# Patient Record
Sex: Female | Born: 1995 | Race: Black or African American | Hispanic: No | Marital: Married | Smoking: Never smoker
Health system: Southern US, Community
[De-identification: ages and names within clinical notes are randomized; demographics above are authoritative.]

## PROBLEM LIST (undated history)

## (undated) ENCOUNTER — Inpatient Hospital Stay: Payer: Self-pay

## (undated) DIAGNOSIS — D649 Anemia, unspecified: Secondary | ICD-10-CM

## (undated) DIAGNOSIS — O1490 Unspecified pre-eclampsia, unspecified trimester: Secondary | ICD-10-CM

---

## 2013-06-26 DIAGNOSIS — O1490 Unspecified pre-eclampsia, unspecified trimester: Secondary | ICD-10-CM

## 2016-03-24 LAB — OB RESULTS CONSOLE HIV ANTIBODY (ROUTINE TESTING): HIV: NONREACTIVE

## 2016-03-24 LAB — OB RESULTS CONSOLE VARICELLA ZOSTER ANTIBODY, IGG: VARICELLA IGG: NON-IMMUNE/NOT IMMUNE

## 2016-03-24 LAB — OB RESULTS CONSOLE HEPATITIS B SURFACE ANTIGEN: HEP B S AG: NEGATIVE

## 2016-03-24 LAB — OB RESULTS CONSOLE RUBELLA ANTIBODY, IGM: Rubella: IMMUNE

## 2016-06-08 ENCOUNTER — Inpatient Hospital Stay
Admission: EM | Admit: 2016-06-08 | Discharge: 2016-06-08 | Disposition: A | Discharge status: Home / Self Care | Payer: Self-pay | Attending: Advanced Practice Midwife | Admitting: Advanced Practice Midwife

## 2016-06-08 DIAGNOSIS — O26852 Spotting complicating pregnancy, second trimester: Secondary | ICD-10-CM | POA: Diagnosis not present

## 2016-06-08 DIAGNOSIS — Z3A20 20 weeks gestation of pregnancy: Secondary | ICD-10-CM | POA: Diagnosis not present

## 2016-06-08 DIAGNOSIS — R109 Unspecified abdominal pain: Secondary | ICD-10-CM | POA: Diagnosis not present

## 2016-06-08 NOTE — Discharge Instructions (Signed)
Discharge & follow up instructions given, all questions answered. °

## 2016-06-08 NOTE — Discharge Summary (Signed)
Physician Final Progress Note  Patient ID: Amanda Welch MRN: [id number] DOB/AGE: 1995-05-09 21 y.o.  Admit date: 06/08/2016 Admitting provider: Tresea Mall, CNM Discharge date: 06/08/2016   Admission Diagnoses: bleeding, lower abdominal pain  Discharge Diagnoses:  Active Problems:   * No active hospital problems. * IUP at 103w2d with positive fetal heart tones, positive fetal movement, no active bleeding, no contractions  History of Present Illness: The patient is a 21 y.o. female G2P1001 at [redacted]w[redacted]d who presents for bleeding yesterday with scant spotting this morning. Pt admits most recent intercourse was yesterday. She states she has had lower abdominal pain throughout the pregnancy. Pt was placed on toco monitor, heart tones with hand held doppler. Pt was receiving care in [facility] and recently moved to [place]. She has not yet established prenatal care in this area.    History reviewed. No pertinent past medical history.  History reviewed. No pertinent surgical history.  No current facility-administered medications on file prior to encounter.    No current outpatient prescriptions on file prior to encounter.    No Known Allergies  Social History   Social History  . Marital status: Single    Spouse name: N/A  . Number of children: N/A  . Years of education: N/A   Occupational History  . Not on file.   Social History Main Topics  . Smoking status: Not on file  . Smokeless tobacco: Not on file  . Alcohol use Not on file  . Drug use: Unknown  . Sexual activity: Not on file   Other Topics Concern  . Not on file   Social History Narrative  . No narrative on file    Physical Exam: BP 108/71   Pulse 95   Temp 98.5 F (36.9 C) (Oral)   Gen: NAD CV: RRR Pulm: CTAB Pelvic: deferred, no current bleeding Toco: negative Fetal Well Being: doppler tones 145 to 155 Ext: no evidence of DVT  Consults: None  Significant Findings/ Diagnostic Studies: labs:  none  Procedures: on Toco monitor  Discharge Condition: good  Disposition: 01-Home or Self Care  Diet: Regular diet  Discharge Activity: Activity as tolerated  Discharge Instructions    Discharge activity:  No Restrictions    Complete by:  As directed    Discharge diet:  No restrictions    Complete by:  As directed    No sexual activity restrictions    Complete by:  As directed    Notify physician for a general feeling that "something is not right"    Complete by:  As directed    Notify physician for increase or change in vaginal discharge    Complete by:  As directed    Notify physician for intestinal cramps, with or without diarrhea, sometimes described as "gas pain"    Complete by:  As directed    Notify physician for leaking of fluid    Complete by:  As directed    Notify physician for low, dull backache, unrelieved by heat or Tylenol    Complete by:  As directed    Notify physician for menstrual like cramps    Complete by:  As directed    Notify physician for pelvic pressure    Complete by:  As directed    Notify physician for uterine contractions.  These may be painless and feel like the uterus is tightening or the baby is  "balling up"    Complete by:  As directed    Notify physician for vaginal  bleeding    Complete by:  As directed    PRETERM LABOR:  Includes any of the follwing symptoms that occur between 20 - [redacted] weeks gestation.  If these symptoms are not stopped, preterm labor can result in preterm delivery, placing your baby at risk    Complete by:  As directed    Sexual Activity:      Complete by:  As directed      Allergies as of 06/08/2016   No Known Allergies     Medication List    TAKE these medications   prenatal multivitamin Tabs tablet Take 1 tablet by mouth daily at 12 noon.      Follow-up Information    establish prenatal care with local provider Follow up.           Total time spent taking care of this patient: 15  minutes  Signed: Tresea Mall, CNM  06/08/2016, 8:43 PM

## 2016-07-23 ENCOUNTER — Observation Stay
Admission: EM | Admit: 2016-07-23 | Discharge: 2016-07-23 | Disposition: A | Discharge status: Home / Self Care | Payer: Medicaid Other | Attending: Certified Nurse Midwife | Admitting: Certified Nurse Midwife

## 2016-07-23 DIAGNOSIS — O34219 Maternal care for unspecified type scar from previous cesarean delivery: Secondary | ICD-10-CM | POA: Insufficient documentation

## 2016-07-23 DIAGNOSIS — R112 Nausea with vomiting, unspecified: Secondary | ICD-10-CM

## 2016-07-23 DIAGNOSIS — O9989 Other specified diseases and conditions complicating pregnancy, childbirth and the puerperium: Principal | ICD-10-CM | POA: Insufficient documentation

## 2016-07-23 DIAGNOSIS — O219 Vomiting of pregnancy, unspecified: Secondary | ICD-10-CM | POA: Diagnosis present

## 2016-07-23 DIAGNOSIS — R1030 Lower abdominal pain, unspecified: Secondary | ICD-10-CM | POA: Insufficient documentation

## 2016-07-23 DIAGNOSIS — O0932 Supervision of pregnancy with insufficient antenatal care, second trimester: Secondary | ICD-10-CM | POA: Diagnosis not present

## 2016-07-23 DIAGNOSIS — O09299 Supervision of pregnancy with other poor reproductive or obstetric history, unspecified trimester: Secondary | ICD-10-CM

## 2016-07-23 DIAGNOSIS — Z833 Family history of diabetes mellitus: Secondary | ICD-10-CM | POA: Diagnosis not present

## 2016-07-23 DIAGNOSIS — O09892 Supervision of other high risk pregnancies, second trimester: Secondary | ICD-10-CM | POA: Insufficient documentation

## 2016-07-23 DIAGNOSIS — Z3A26 26 weeks gestation of pregnancy: Secondary | ICD-10-CM | POA: Insufficient documentation

## 2016-07-23 HISTORY — DX: Anemia, unspecified: D64.9

## 2016-07-23 HISTORY — DX: Unspecified pre-eclampsia, unspecified trimester: O14.90

## 2016-07-23 LAB — URINALYSIS, COMPLETE (UACMP) WITH MICROSCOPIC
BACTERIA UA: NONE SEEN
BILIRUBIN URINE: NEGATIVE
Glucose, UA: NEGATIVE mg/dL
HGB URINE DIPSTICK: NEGATIVE
KETONES UR: NEGATIVE mg/dL
Nitrite: NEGATIVE
Protein, ur: 30 mg/dL — AB
Specific Gravity, Urine: 1.021 (ref 1.005–1.030)
pH: 7 (ref 5.0–8.0)

## 2016-07-23 LAB — BASIC METABOLIC PANEL
Anion gap: 6 (ref 5–15)
BUN: 9 mg/dL (ref 6–20)
CO2: 22 mmol/L (ref 22–32)
CREATININE: 0.41 mg/dL — AB (ref 0.44–1.00)
Calcium: 8.3 mg/dL — ABNORMAL LOW (ref 8.9–10.3)
Chloride: 108 mmol/L (ref 101–111)
GFR calc non Af Amer: >60 mL/min (ref >60)
Glucose, Bld: 81 mg/dL (ref 65–99)
Potassium: 3.9 mmol/L (ref 3.5–5.1)
SODIUM: 136 mmol/L (ref 135–145)

## 2016-07-23 LAB — CBC
HEMATOCRIT: 28.7 % — AB (ref 35.0–47.0)
HEMOGLOBIN: 9.4 g/dL — AB (ref 12.0–16.0)
MCH: 23.1 pg — ABNORMAL LOW (ref 26.0–34.0)
MCHC: 32.6 g/dL (ref 32.0–36.0)
MCV: 70.7 fL — AB (ref 80.0–100.0)
Platelets: 297 10*3/uL (ref 150–440)
RBC: 4.06 MIL/uL (ref 3.80–5.20)
RDW: 17.3 % — ABNORMAL HIGH (ref 11.5–14.5)
WBC: 11.2 10*3/uL — AB (ref 3.6–11.0)

## 2016-07-23 LAB — CHLAMYDIA/NGC RT PCR (ARMC ONLY)
CHLAMYDIA TR: NOT DETECTED
N gonorrhoeae: NOT DETECTED

## 2016-07-23 MED ORDER — CEPHALEXIN 500 MG PO CAPS: 500.0000 mg | ORAL_CAPSULE | Freq: Two times a day (BID) | ORAL | 0 refills | Status: DC

## 2016-07-23 MED ORDER — LACTATED RINGERS IV SOLN
INTRAVENOUS | Status: DC
  Administered 2016-07-23: 10:00:00 via INTRAVENOUS

## 2016-07-23 MED ORDER — CITRANATAL BLOOM 90-1 MG PO TABS: 1.0000 | ORAL_TABLET | Freq: Every day | ORAL | 11 refills | Status: DC

## 2016-07-23 MED ORDER — ONDANSETRON HCL 4 MG/2ML IJ SOLN
4.0000 mg | Freq: Four times a day (QID) | INTRAMUSCULAR | Status: DC | PRN
  Administered 2016-07-23: 4 mg via INTRAVENOUS
  Filled 2016-07-23: qty 2

## 2016-07-23 MED ORDER — LACTATED RINGERS IV BOLUS (SEPSIS)
500.0000 mL | Freq: Once | INTRAVENOUS | Status: AC
  Administered 2016-07-23: 500 mL via INTRAVENOUS

## 2016-07-23 MED ORDER — CEFOXITIN SODIUM-DEXTROSE 2-2.2 GM-% IV SOLR (PREMIX)
2.0000 g | Freq: Once | INTRAVENOUS | Status: AC
  Administered 2016-07-23: 2000 mg via INTRAVENOUS
  Filled 2016-07-23: qty 50

## 2016-07-23 MED ORDER — ONDANSETRON 4 MG PO TBDP: 4.0000 mg | ORAL_TABLET | Freq: Four times a day (QID) | ORAL | 0 refills | Status: DC | PRN

## 2016-07-23 NOTE — Discharge Instructions (Signed)
Nausea and Vomiting, Adult Feeling sick to your stomach (nausea) means that your stomach is upset or you feel like you have to throw up (vomit). Feeling more and more sick to your stomach can lead to throwing up. Throwing up happens when food and liquid from your stomach are thrown up and out the mouth. Throwing up can make you feel weak and cause you to get dehydrated. Dehydration can make you tired and thirsty, make you have a dry mouth, and make it so you pee (urinate) less often. Older adults and people with other diseases or a weak defense system (immune system) are at higher risk for dehydration. If you feel sick to your stomach or if you throw up, it is important to follow instructions from your doctor about how to take care of yourself. Follow these instructions at home: Eating and drinking Follow these instructions as told by your doctor:  Take an oral rehydration solution (ORS). This is a drink that is sold at pharmacies and stores.  Drink clear fluids in small amounts as you are able, such as: ? Water. ? Ice chips. ? Diluted fruit juice. ? Low-calorie sports drinks.  Eat bland, easy-to-digest foods in small amounts as you are able, such as: ? Bananas. ? Applesauce. ? Rice. ? Low-fat (lean) meats. ? Toast. ? Crackers.  Avoid fluids that have a lot of sugar or caffeine in them.  Avoid alcohol.  Avoid spicy or fatty foods.  General instructions  Drink enough fluid to keep your pee (urine) clear or pale yellow.  Wash your hands often. If you cannot use soap and water, use hand sanitizer.  Make sure that all people in your home wash their hands well and often.  Take over-the-counter and prescription medicines only as told by your doctor.  Rest at home while you get better.  Watch your condition for any changes.  Breathe slowly and deeply when you feel sick to your stomach.  Keep all follow-up visits as told by your doctor. This is important. Contact a doctor  if:  You have a fever.  You cannot keep fluids down.  Your symptoms get worse.  You have new symptoms.  You feel sick to your stomach for more than two days.  You feel light-headed or dizzy.  You have a headache.  You have muscle cramps. Get help right away if:  You have pain in your chest, neck, arm, or jaw.  You feel very weak or you pass out (faint).  You throw up again and again.  You see blood in your throw-up.  Your throw-up looks like black coffee grounds.  You have bloody or black poop (stools) or poop that look like tar.  You have a very bad headache, a stiff neck, or both.  You have a rash.  You have very bad pain, cramping, or bloating in your belly (abdomen).  You have trouble breathing.  You are breathing very quickly.  Your heart is beating very quickly.  Your skin feels cold and clammy.  You feel confused.  You have pain when you pee.  You have signs of dehydration, such as: ? Dark pee, hardly any pee, or no pee. ? Cracked lips. ? Dry mouth. ? Sunken eyes. ? Sleepiness. ? Weakness. These symptoms may be an emergency. Do not wait to see if the symptoms will go away. Get medical help right away. Call your local emergency services (911 in the [place] Do not drive yourself to the hospital. This information is   not intended to replace advice given to you by your health care provider. Make sure you discuss any questions you have with your health care provider. Document Released: 07/28/2007 Document Revised: 08/29/2015 Document Reviewed: 10/15/2014 Elsevier Interactive Patient Education  2018 Elsevier Inc.  

## 2016-07-23 NOTE — Progress Notes (Signed)
EFM removed, Toco reapplied.

## 2016-07-23 NOTE — Progress Notes (Signed)
Pt discharged home.  Discharge instructions, prescriptions given to and reviewed with pt.  Pt verbalized understanding the need to establish prenatal care here in [place].  Pt ambulated out with family.

## 2016-07-23 NOTE — Progress Notes (Signed)
Pt removed from monitor, Amanda Welch, CNM in to see patient.  Pt to be discharged home after IV fluids done infusing with PO antibiotics.

## 2016-07-23 NOTE — OB Triage Note (Signed)
Pt here with compliants of pressure with lower abdominal and back pain since yesterday morning - pt. decided to come to hospital to be evaluated.  Pt states that there was a small amount or fluid with stringy blood yesterday morning, but none since.  Positive for fetal movement. Last sexual encounter was yesterday afternoon.  Pt connected to external fetal monitor - FHR ; toco applied, abd. Soft. Clean catch urine collected for additional test if needed. Will obtain nitrazine.

## 2016-07-23 NOTE — Final Progress Note (Signed)
Physician Final Progress Note  Patient ID: Amanda Welch MRN: [id number] DOB/AGE: 23/08/1995 20 y.o.  Admit date: 07/23/2016 Admitting provider: Dr Sarita HaverHarris/ Jamieon Lannen L. Sharen HonesGutierrez, CNM Discharge date: 07/23/2016   Admission Diagnoses: IUP at 26 weeks 5d with lower abdominal cramping Nausea and vomiting  Discharge Diagnoses:  Active Problems:   Nausea and vomiting during pregnancy   Lower abdominal pain   Pregnancy with history of cesarean section, antepartum   Insufficient prenatal care in second trimester   Hx of preeclampsia, prior pregnancy, currently pregnant Probable UTI  Consults: None  Significant Findings/ Diagnostic Studies: 21 year old G2 P1001 presents to L&D at 26wk5d gestation by an  EDC=10/24/2016 based on an 9wk3d ultrasound with complaints of lower abdominal cramping, nausea and vomiting. She began feeling badly 2 days ago in the afternoon and came home early from work. Nausea yesterday began worsening, although she was able to keep down fried chicken last night. Has vomited about three times since 0100 this AM. Denies diarrhea. Last BM yesterday was normal consistency. Began having some lower abdominal pressure and sharp pains in mid lower abdomen. Feeling some irregular tightening also since last night. Did have intercourse yesterday afternoon. Reports having some blood tinged mucous this AM x1. No dysuria, vulvovaginal itching or irritation. Denies a fever, but did feel some chills this AM when vomiting started. No other family members with similar symptoms. Has not taken any medication for nausea or lower abdominal discomfort.  [place] [facility], but has not been seen there since [redacted] weeks gestation. She moved to [place] area in March and has not established prenatal care here yet. This pregnancy complicated by anemia and prior history of Cesarean section 06/26/2013 for failure to progress? Vs fetal intolerance. She had preeclampsia with her first pregnancy  and  labor was induced.  Records were obtained from her previous prenatal provider and reviewed.  Current medications: [employer] prenatal vitamins  Past Medical History:  Diagnosis Date  . Anemia   . Preeclampsia    with G1   Family History  Problem Relation Age of Onset  . Diabetes Maternal Grandfather   . Breast cancer Neg Hx   . Ovarian cancer Neg Hx    Social History   Social History  . Marital status: Single    Spouse name: N/A  . Number of children: N/A  . Years of education: N/A   Social History Main Topics  . Smoking status: Never Smoker  . Smokeless tobacco: Never Used  . Alcohol use No  . Drug use: No  . Sexual activity: Yes   Other Topics Concern  . None   Social History Narrative  . None   Exam: BP (!) 103/58   Pulse 87   Temp 98.8 F (37.1 C) (Oral)   Resp 18   Ht 4\' 11"  (1.499 m)   Wt 59 kg (130 lb)   BMI 26.26 kg/m   General: petite BF, gravid,  in no acute distress. Speaks very quietly HEENT: sclera non icteric Neck: thyroid not enlarged, no nodules Chest: breathing not labored, CTAB Heart: RRR without murmur Abdomen: NABS, soft, mild tenderness over LUS, NT upper abdomen FHR: 145-150 baseline with accelerations to 160, moderate variability Toco: some uterine irritability and occasional contraction initially, which resolved with IV hydration. 3 contractions noted in last 1.5 hours. Extremities: no edema Neuro: alert, awake and answering questions appropriately   Ultrasound: cephalic/ posterior placenta/ baby moving well/ subjectively normal amniotic fluid  Pelvic exam: Vulva:  no lesions or inflammation Vagina: swabbed for clear to white discharge Cervix: 0.5cm/ Thick/OOP  Results for orders placed or performed during the hospital encounter of 07/23/16 (from the past 24 hour(s))  Urinalysis, Complete w Microscopic     Status: Abnormal   Collection Time: 07/23/16  9:08 AM  Result Value Ref Range   Color, Urine YELLOW (A) YELLOW    APPearance HAZY (A) CLEAR   Specific Gravity, Urine 1.021 1.005 - 1.030   pH 7.0 5.0 - 8.0   Glucose, UA NEGATIVE NEGATIVE mg/dL   Hgb urine dipstick NEGATIVE NEGATIVE   Bilirubin Urine NEGATIVE NEGATIVE   Ketones, ur NEGATIVE NEGATIVE mg/dL   Protein, ur 30 (A) NEGATIVE mg/dL   Nitrite NEGATIVE NEGATIVE   Leukocytes, UA TRACE (A) NEGATIVE   RBC / HPF 0-5 0 - 5 RBC/hpf   WBC, UA 6-30 0 - 5 WBC/hpf   Bacteria, UA NONE SEEN NONE SEEN   Squamous Epithelial / LPF 0-5 (A) NONE SEEN   Mucous PRESENT   CBC     Status: Abnormal   Collection Time: 07/23/16  9:44 AM  Result Value Ref Range   WBC 11.2 (H) 3.6 - 11.0 K/uL   RBC 4.06 3.80 - 5.20 MIL/uL   Hemoglobin 9.4 (L) 12.0 - 16.0 g/dL   HCT 16.1 (L) 09.6 - 04.5 %   MCV 70.7 (L) 80.0 - 100.0 fL   MCH 23.1 (L) 26.0 - 34.0 pg   MCHC 32.6 32.0 - 36.0 g/dL   RDW 40.9 (H) 81.1 - 91.4 %   Platelets 297 150 - 440 K/uL  Basic metabolic panel     Status: Abnormal   Collection Time: 07/23/16  9:44 AM  Result Value Ref Range   Sodium 136 135 - 145 mmol/L   Potassium 3.9 3.5 - 5.1 mmol/L   Chloride 108 101 - 111 mmol/L   CO2 22 22 - 32 mmol/L   Glucose, Bld 81 65 - 99 mg/dL   BUN 9 6 - 20 mg/dL   Creatinine, Ser 7.82 (L) 0.44 - 1.00 mg/dL   Calcium 8.3 (L) 8.9 - 10.3 mg/dL   GFR calc non Af Amer >60 >60 mL/min   GFR calc Af Amer >60 >60 mL/min   Anion gap 6 5 - 15  Chlamydia/NGC rt PCR ([facility] only)     Status: None   Collection Time: 07/23/16 11:29 AM  Result Value Ref Range   Specimen source GC/Chlam ENDOCERVICAL    Chlamydia Tr NOT DETECTED NOT DETECTED   N gonorrhoeae NOT DETECTED NOT DETECTED   Wet prep negative for Trich, clue cells, hyphae, or amine odor  A: IUP at 26wk5d with nausea and vomiting and lower abdominal pain Possible UTI with moderate leukocytes in urine CCMS Uterine irritability possibly from above two concerns Insufficient prenatal care Anemia Reassuring FHR tracing  P: Patient was hydrated via IV and received  4 mgm Zofran IV. She had no further vomiting and was eventually able to tolerate fluids and crackers Given one dose of Mefoxin 2 GMs IV for treatment of UTI and urine culture was requested. Will discharge home on BRAT diet, Zofran, Keflex 500 po BID x 7 days, and change her prenatal to [facility].  Given a copy of her prenatal records to give to her prenatal care provider. Encouraged to start her care with [facility] if unable to get an appt with private group  Procedures: none  Discharge Condition: stable  Disposition: 01-Home or Self Care  Diet: Encourage  fluids and BRAT diet, advance as tolerated  Discharge Activity: Activity as tolerated and work note given thru tomorrow to stay home from work  Discharge Instructions    Discharge patient    Complete by:  As directed    Discharge disposition:  01-Home or Self Care   Discharge patient date:  07/23/2016   Discontinue IV    Complete by:  As directed    When liter in, if tolerating po     Allergies as of 07/23/2016   No Known Allergies     Medication List    STOP taking these medications   prenatal multivitamin Tabs tablet     TAKE these medications   cephALEXin 500 MG capsule Commonly known as:  KEFLEX Take 1 capsule (500 mg total) by mouth 2 (two) times daily.   CITRANATAL BLOOM 90-1 MG Tabs Take 1 tablet by mouth daily.   ondansetron 4 MG disintegrating tablet Commonly known as:  ZOFRAN ODT Take 1 tablet (4 mg total) by mouth every 6 (six) hours as needed for nausea.      Follow-up Information    [facility]. Schedule an appointment as soon as possible for a visit.   Why:  Call to establish prenatal care. Contact information: [address] [place] [address] [phone]          Total time spent taking care of this patient: 30 minutes  Signed: Farrel Conners 07/23/2016, 4:51 PM

## 2016-07-25 LAB — URINE CULTURE
Culture: 8000 — AB
Special Requests: NORMAL

## 2016-07-26 ENCOUNTER — Observation Stay
Admission: EM | Admit: 2016-07-26 | Discharge: 2016-07-27 | Disposition: A | Discharge status: Home / Self Care | Payer: Medicaid Other | Source: Home / Self Care | Admitting: Obstetrics and Gynecology

## 2016-07-26 ENCOUNTER — Encounter: Payer: Self-pay | Admitting: Certified Nurse Midwife

## 2016-07-26 DIAGNOSIS — O2342 Unspecified infection of urinary tract in pregnancy, second trimester: Secondary | ICD-10-CM

## 2016-07-26 DIAGNOSIS — B951 Streptococcus, group B, as the cause of diseases classified elsewhere: Secondary | ICD-10-CM | POA: Insufficient documentation

## 2016-07-26 DIAGNOSIS — O4702 False labor before 37 completed weeks of gestation, second trimester: Secondary | ICD-10-CM

## 2016-07-26 DIAGNOSIS — O99012 Anemia complicating pregnancy, second trimester: Secondary | ICD-10-CM | POA: Insufficient documentation

## 2016-07-26 DIAGNOSIS — Z7901 Long term (current) use of anticoagulants: Secondary | ICD-10-CM | POA: Diagnosis not present

## 2016-07-26 DIAGNOSIS — Z3A27 27 weeks gestation of pregnancy: Secondary | ICD-10-CM

## 2016-07-26 DIAGNOSIS — D649 Anemia, unspecified: Secondary | ICD-10-CM | POA: Diagnosis not present

## 2016-07-26 DIAGNOSIS — R109 Unspecified abdominal pain: Secondary | ICD-10-CM | POA: Diagnosis not present

## 2016-07-26 DIAGNOSIS — R1030 Lower abdominal pain, unspecified: Secondary | ICD-10-CM

## 2016-07-26 DIAGNOSIS — E860 Dehydration: Secondary | ICD-10-CM

## 2016-07-26 DIAGNOSIS — O26892 Other specified pregnancy related conditions, second trimester: Secondary | ICD-10-CM | POA: Diagnosis not present

## 2016-07-26 DIAGNOSIS — O34219 Maternal care for unspecified type scar from previous cesarean delivery: Secondary | ICD-10-CM | POA: Insufficient documentation

## 2016-07-26 DIAGNOSIS — Z79899 Other long term (current) drug therapy: Secondary | ICD-10-CM | POA: Insufficient documentation

## 2016-07-26 LAB — URINALYSIS, COMPLETE (UACMP) WITH MICROSCOPIC
BACTERIA UA: NONE SEEN
BILIRUBIN URINE: NEGATIVE
Glucose, UA: NEGATIVE mg/dL
HGB URINE DIPSTICK: NEGATIVE
Ketones, ur: NEGATIVE mg/dL
Leukocytes, UA: NEGATIVE
NITRITE: NEGATIVE
PROTEIN: NEGATIVE mg/dL
SPECIFIC GRAVITY, URINE: 1.02 (ref 1.005–1.030)
pH: 7 (ref 5.0–8.0)

## 2016-07-26 MED ORDER — LACTATED RINGERS IV SOLN
INTRAVENOUS | Status: DC
  Administered 2016-07-27: via INTRAVENOUS

## 2016-07-26 MED ORDER — ACETAMINOPHEN 500 MG PO TABS
1000.0000 mg | ORAL_TABLET | Freq: Once | ORAL | Status: AC
  Administered 2016-07-27: 1000 mg via ORAL

## 2016-07-26 MED ORDER — DIPHENHYDRAMINE HCL 25 MG PO CAPS
25.0000 mg | ORAL_CAPSULE | Freq: Once | ORAL | Status: AC
  Administered 2016-07-27: 25 mg via ORAL

## 2016-07-26 MED ORDER — LACTATED RINGERS IV BOLUS (SEPSIS)
1000.0000 mL | Freq: Once | INTRAVENOUS | Status: AC
  Administered 2016-07-26: 1000 mL via INTRAVENOUS

## 2016-07-26 NOTE — OB Triage Note (Signed)
Pt presents to L&D with c/o abd pain that feels like contractions. Pt was seen 6/1 and diagnosed with bladder infection. Received IV antibiotics and is currently taking PO antibiotics. Pt reports good fetal movement and denies vaginal bleeding or leaking fluid. EFM and toco applied and explained. Plan to monitor fetal and maternal well being and assess pt complaint.

## 2016-07-26 NOTE — H&P (Signed)
OB History & Physical   History of Present Illness:  Chief Complaint: abdominal pain  HPI:  Amanda Welch is a 21 y.o. G2P1001 female at 4654w2d dated by U/S.  She moved to this area in March and has not had prenatal care since. She is scheduled to begin prenatal [facility]. She was seen on L&D on 6/1 with similar complaint and was started on antibiotics for UTI. She also had abnormal metabolic panel levels of calcium and creatinine. She states the pain she is feeling is worse since she started the antibiotics.   She reports contractions.   She denies leakage of fluid.   She denies vaginal bleeding.   She reports fetal movement.  She denies s/s of illness, no N/V, F/C, HA, SOB. She reports abdominal pain for approximately one week. She admits to drinking 1 bottle of water per day.   Maternal Medical History:   Past Medical History:  Diagnosis Date  . Anemia   . Preeclampsia    with G1    Past Surgical History:  Procedure Laterality Date  . CESAREAN SECTION  06/26/2013    No Known Allergies  Prior to Admission medications   Medication Sig Start Date End Date Taking? Authorizing Provider  cephALEXin (KEFLEX) 500 MG capsule Take 1 capsule (500 mg total) by mouth 2 (two) times daily. 07/23/16 07/30/16 Yes Farrel ConnersGutierrez, Colleen, CNM  Prenatal-DSS-FeCb-FeGl-FA (CITRANATAL BLOOM) 90-1 MG TABS Take 1 tablet by mouth daily. 07/23/16  Yes Farrel ConnersGutierrez, Colleen, CNM  ondansetron (ZOFRAN ODT) 4 MG disintegrating tablet Take 1 tablet (4 mg total) by mouth every 6 (six) hours as needed for nausea. Patient not taking: Reported on 07/26/2016 07/23/16   Farrel ConnersGutierrez, Colleen, CNM    OB History  Gravida Para Term Preterm AB Living  2 1 1  0 0 1  SAB TAB Ectopic Multiple Live Births  0 0 0 0 1    # Outcome Date GA Lbr Len/2nd Weight Sex Delivery Anes PTL Lv  2 Current           1 Term 06/26/13 4873w0d  5 lb 8 oz (2.495 kg) F CS-LTranv Spinal Y LIV     Complications: Failure to Progress in First  Stage,Preeclampsia      Prenatal care site: Scheduled to [facility]  Social History: She  reports that she has never smoked. She has never used smokeless tobacco. She reports that she does not drink alcohol or use drugs.  Family History: family history includes Diabetes in her maternal grandfather.  No family history of gynecologic cancers  Review of Systems: Negative x 10 systems reviewed except as noted in the HPI.    Physical Exam:  Vital Signs: BP 111/67   Pulse 97   Temp 98.5 F (36.9 C) (Oral)   Resp 18   Ht 4\' 11"  (1.499 m)   Wt 130 lb (59 kg)   BMI 26.26 kg/m  Constitutional: Well nourished, well developed female in mild distress.  HEENT: normal Skin: Warm and dry.  Cardiovascular: Regular rate and rhythm.   Extremity: no edema  Respiratory: Clear to auscultation bilateral. Normal respiratory effort Abdomen: FHT present Back: no CVAT Neuro: DTRs 2+, Cranial nerves grossly intact Psych: Alert and Oriented x3. No memory deficits. Quiet mood and affect.  MS: normal gait, normal bilateral lower extremity ROM/strength/stability.  Pelvic exam:  is not limited by body habitus Cervix: 0.5 cm   Pertinent Results:   Baseline FHR: 140 beats/min   Variability: moderate  Accelerations: present   Decelerations: absent Contractions: present frequency: q 2-7 Overall assessment: Category I   Assessment:  Amanda Welch is a 21 y.o. G19P1001 female at [redacted]w[redacted]d with abdominal pain, uterine irritability, dehydration insufficient prenatal care, anemia, hypocalcemia, reassuring FHR tracing.   Plan:  1. Observation overnight 2. UA, Urine Culture 3. IV fluid bolus and continuous during observation 4. Calcium supplement  5. Fetal well-being: Category I 6. Tylenol, benadryl for rest overnight   Tresea Mall, CNM 07/27/2016 12:03 AM

## 2016-07-27 ENCOUNTER — Observation Stay
Admission: EM | Admit: 2016-07-27 | Discharge: 2016-07-28 | Disposition: A | Discharge status: Home / Self Care | Payer: Medicaid Other | Attending: Certified Nurse Midwife | Admitting: Certified Nurse Midwife

## 2016-07-27 DIAGNOSIS — O26892 Other specified pregnancy related conditions, second trimester: Secondary | ICD-10-CM | POA: Diagnosis not present

## 2016-07-27 DIAGNOSIS — R109 Unspecified abdominal pain: Secondary | ICD-10-CM

## 2016-07-27 DIAGNOSIS — O26899 Other specified pregnancy related conditions, unspecified trimester: Secondary | ICD-10-CM | POA: Diagnosis present

## 2016-07-27 DIAGNOSIS — Z3A27 27 weeks gestation of pregnancy: Secondary | ICD-10-CM | POA: Diagnosis not present

## 2016-07-27 MED ORDER — ACETAMINOPHEN 500 MG PO TABS
ORAL_TABLET | ORAL | Status: AC
  Filled 2016-07-27: qty 2

## 2016-07-27 MED ORDER — BETAMETHASONE SOD PHOS & ACET 6 (3-3) MG/ML IJ SUSP
INTRAMUSCULAR | Status: AC
  Filled 2016-07-27: qty 1

## 2016-07-27 MED ORDER — TERBUTALINE SULFATE 1 MG/ML IJ SOLN
INTRAMUSCULAR | Status: AC
  Filled 2016-07-27: qty 1

## 2016-07-27 MED ORDER — CALCIUM CITRATE 950 (200 CA) MG PO TABS
200.0000 mg | ORAL_TABLET | Freq: Every day | ORAL | Status: DC
  Filled 2016-07-27: qty 1

## 2016-07-27 MED ORDER — BETAMETHASONE SOD PHOS & ACET 6 (3-3) MG/ML IJ SUSP
12.0000 mg | INTRAMUSCULAR | Status: DC
  Administered 2016-07-27: 12 mg via INTRAMUSCULAR

## 2016-07-27 MED ORDER — MORPHINE SULFATE (PF) 10 MG/ML IV SOLN
INTRAVENOUS | Status: AC
  Filled 2016-07-27: qty 1

## 2016-07-27 MED ORDER — TERBUTALINE SULFATE 1 MG/ML IJ SOLN
0.2500 mg | Freq: Once | INTRAMUSCULAR | Status: AC
  Administered 2016-07-27: 0.25 mg via SUBCUTANEOUS

## 2016-07-27 MED ORDER — MORPHINE SULFATE (PF) 10 MG/ML IV SOLN
10.0000 mg | Freq: Once | INTRAVENOUS | Status: AC
  Administered 2016-07-27: 10 mg via INTRAMUSCULAR

## 2016-07-27 MED ORDER — DIPHENHYDRAMINE HCL 25 MG PO CAPS
ORAL_CAPSULE | ORAL | Status: AC
  Filled 2016-07-27: qty 1

## 2016-07-27 NOTE — Final Progress Note (Signed)
Physician Final Progress Note  Patient ID: Amanda Welch MRN: [id number] DOB/AGE: 23/08/1995 20 y.o.  Admit date: 07/26/2016 Admitting provider: Dr Fayne Norriejackson/ Manuela Halbur L. Sharen HonesGutierrez, CNM Discharge date: 07/27/2016   Admission Diagnoses: Threatened preterm labor at 3827 wk2 days Lower abdominal pain  Discharge Diagnoses:  Same as above  Consults:none  Significant Findings/ Diagnostic Studies: Amanda DachKiana Welch is a 21 y.o. 912P1001 female with EDC=10/24/2016 at 1033w2d dated by 9 week U/S.  She presents with lower abdominal pain and tightening of uterus that started last night. The lower abdominal pain is constant, but worsens with contractions.  She moved to this area in March and has not had prenatal care since. She is wanting to transfer [facility] to  [facility]. She was seen on L&D on 6/1 with similar complaints of lower abdominal pain and was started on Keflex  for UTI. Urine culture returned positive for GBS. Hx remarkable for aprior Cesarean section 3 years ago. On arrival patient was contracting irregularly every 2-7 min apart. Cervix was 0.5cm dilated and 50% (no significant change from 6/1).  She denied leakage of fluid.   She denied vaginal bleeding.She denies s/s of illness, no N/V, F/C, HA, SOB. She admits to drinking one bottle of water a day. History remarkable for previous Cesarean section.  On exam: contractions palpated mild. No tenderness over her old Cesarean section scar Her contractions spaced out with IV hydration, one dose of terbutaline and IM morphine. The fetal heart rate pattern was Cat 1 with baseline 140s and accelerations to 150s to 160 with moderate variability. A/P: Since she was seen twice in one week for lower abdominal pain and contractions, she was offered and accepted betamethasone and will return tomorrow to L&D for another betamethasone and have a FFN done at that time. She was scheduled for a follow up appt/ [facility]. Work excuse given for the remainder of the  week. Encouraged drinking 6 bottles of water daily. Pelvic rest recommended. Also recommended getting a maternity support garment. Procedures: none  Discharge Condition: stable  Disposition: 01-Home or Self Care  Diet: Regular diet  Discharge Activity: No heavy lifting. Pelvic rest. Avoid prolonged standing or walking.  Discharge Instructions    Discharge patient    Complete by:  As directed    Discharge disposition:  01-Home or Self Care   Discharge patient date:  07/27/2016     Allergies as of 07/27/2016   No Known Allergies     Medication List    TAKE these medications   cephALEXin 500 MG capsule Commonly known as:  KEFLEX Take 1 capsule (500 mg total) by mouth 2 (two) times daily.   CITRANATAL BLOOM 90-1 MG Tabs Take 1 tablet by mouth daily.   ondansetron 4 MG disintegrating tablet Commonly known as:  ZOFRAN ODT Take 1 tablet (4 mg total) by mouth every 6 (six) hours as needed for nausea.        Total time spent taking care of this patient: 20 minutes  Signed: Farrel Connersolleen Brittani Purdum 07/27/2016, 11:16 AM

## 2016-07-27 NOTE — Discharge Instructions (Signed)
Preterm Labor and Birth Information Pregnancy normally lasts 39-41 weeks. Preterm labor is when labor starts early. It starts before you have been pregnant for 37 whole weeks. What are the risk factors for preterm labor? Preterm labor is more likely to occur in women who:  Have an infection while pregnant.  Have a cervix that is short.  Have gone into preterm labor before.  Have had surgery on their cervix.  Are younger than age 23.  Are older than age 23.  Are African American.  Are pregnant with two or more babies.  Take street drugs while pregnant.  Smoke while pregnant.  Do not gain enough weight while pregnant.  Got pregnant right after another pregnancy.  What are the symptoms of preterm labor? Symptoms of preterm labor include:  Cramps. The cramps may feel like the cramps some women get during their period. The cramps may happen with watery poop (diarrhea).  Pain in the belly (abdomen).  Pain in the lower back.  Regular contractions or tightening. It may feel like your belly is getting tighter.  Pressure in the lower belly that seems to get stronger.  More fluid (discharge) leaking from the vagina. The fluid may be watery or bloody.  Water breaking.  Why is it important to notice signs of preterm labor? Babies who are born early may not be fully developed. They have a higher chance for:  Long-term heart problems.  Long-term lung problems.  Trouble controlling body systems, like breathing.  Bleeding in the brain.  A condition called cerebral palsy.  Learning difficulties.  Death.  These risks are highest for babies who are born before 34 weeks of pregnancy. How is preterm labor treated? Treatment depends on:  How long you were pregnant.  Your condition.  The health of your baby.  Treatment may involve:  Having a stitch (suture) placed in your cervix. When you give birth, your cervix opens so the baby can come out. The stitch keeps the  cervix from opening too soon.  Staying at the hospital.  Taking or getting medicines, such as: ? Hormone medicines. ? Medicines to stop contractions. ? Medicines to help the babys lungs develop. ? Medicines to prevent your baby from having cerebral palsy.  What should I do if I am in preterm labor? If you think you are going into labor too soon, call your doctor right away. How can I prevent preterm labor?  Do not use any tobacco products. ? Examples of these are cigarettes, chewing tobacco, and e-cigarettes. ? If you need help quitting, ask your doctor.  Do not use street drugs.  Do not use any medicines unless you ask your doctor if they are safe for you.  Talk with your doctor before taking any herbal supplements.  Make sure you gain enough weight.  Watch for infection. If you think you might have an infection, get it checked right away.  If you have gone into preterm labor before, tell your doctor.  Stay well hydrated-Drink 6 bottles of water daily.  Avoid intercourse for now or any activities causing orgasm. This information is not intended to replace advice given to you by your health care provider. Make sure you discuss any questions you have with your health care provider. Document Released: 05/07/2008 Document Revised: 07/22/2015 Document Reviewed: 07/02/2015 Elsevier Interactive Patient Education  2018 [employer]

## 2016-07-28 ENCOUNTER — Observation Stay: Payer: Medicaid Other

## 2016-07-28 DIAGNOSIS — O26892 Other specified pregnancy related conditions, second trimester: Secondary | ICD-10-CM | POA: Diagnosis not present

## 2016-07-28 DIAGNOSIS — Z3A27 27 weeks gestation of pregnancy: Secondary | ICD-10-CM | POA: Diagnosis not present

## 2016-07-28 DIAGNOSIS — R109 Unspecified abdominal pain: Secondary | ICD-10-CM

## 2016-07-28 DIAGNOSIS — O26899 Other specified pregnancy related conditions, unspecified trimester: Secondary | ICD-10-CM | POA: Diagnosis present

## 2016-07-28 LAB — URINE CULTURE: Culture: NO GROWTH

## 2016-07-28 LAB — CBC
HEMATOCRIT: 26.5 % — AB (ref 35.0–47.0)
Hemoglobin: 8.6 g/dL — ABNORMAL LOW (ref 12.0–16.0)
MCH: 22.8 pg — AB (ref 26.0–34.0)
MCHC: 32.3 g/dL (ref 32.0–36.0)
MCV: 70.5 fL — AB (ref 80.0–100.0)
Platelets: 285 10*3/uL (ref 150–440)
RBC: 3.75 MIL/uL — AB (ref 3.80–5.20)
RDW: 17 % — ABNORMAL HIGH (ref 11.5–14.5)
WBC: 16.8 10*3/uL — AB (ref 3.6–11.0)

## 2016-07-28 LAB — FETAL FIBRONECTIN: Fetal Fibronectin: NEGATIVE

## 2016-07-28 MED ORDER — BETAMETHASONE SOD PHOS & ACET 6 (3-3) MG/ML IJ SUSP
12.0000 mg | Freq: Once | INTRAMUSCULAR | Status: AC
  Administered 2016-07-28: 12 mg via INTRAMUSCULAR

## 2016-07-28 MED ORDER — LACTATED RINGERS IV SOLN: 500.0000 mL | INTRAVENOUS | Status: DC | PRN

## 2016-07-28 MED ORDER — FERROUS SULFATE 325 (65 FE) MG PO TABS: 325.0000 mg | ORAL_TABLET | Freq: Two times a day (BID) | ORAL | 3 refills | Status: DC

## 2016-07-28 NOTE — Progress Notes (Signed)
Patient ID: Rolanda Campa MRN: [id number] DOB/AGE: 1996/02/09 21 y.o.  Admit date: 07/27/2016 Admitting provider: Dr Sarita Haver L. Sharen Hones, CNM   Chief Complaint Worsening abdominal pain    HPI: Soniyah Rayneris a 21 y.o.G2P1001 female with EDC=10/24/2016 at 32w2ddated by 9 week U/S.She presents for the third time in 4 days with abdominal pain and tightening of uterus. She was just released from the hospital yesterday morning after being treated for threatened preterm labor with IV hydration, terbutaline and IM morphine. She received one dose of BMZ. She reports that her abdominal pain started back up when she left the hospital and worsened after supper. She had a little nausea before returning to the hospital. She had a normal BM today. No diarrhea or constipation. No fever or chills. Was feeling some tightening in her abdomen. Was rating abdominal pain 10/10 on arrival. No vaginal bleeding or leakage of fluid. The  abdominal pain is constant, but worsens with tightening.  She was seen on L&D on 6/1 with similar complaints of lower abdominal pain and N/V. She was hydrated, given IV antiemetics, received Mefoxin x1 and was discharged home on Keflex  for UTI. Urine culture returned positive for GBS. Hx remarkable for a prior Cesarean section 3 years ago. Moved from [facility] in March and has not had prenatal care since then. Has an appointment with [facility] tomorrow. Prenatal care also complicated by anemia. . Past Medical History:  Diagnosis Date  . Anemia   . Preeclampsia    with G1   Past Surgical History:  Procedure Laterality Date  . CESAREAN SECTION  06/26/2013   Social History   Social History  . Marital status: Single    Spouse name: N/A  . Number of children: N/A  . Years of education: N/A   Occupational History  . Not on file.   Social History Main Topics  . Smoking status: Never Smoker  . Smokeless tobacco: Never Used  . Alcohol use No  . Drug use: No  .  Sexual activity: Yes    Partners: Male   Other Topics Concern  . Not on file   Social History Narrative  . No narrative on file   Family History  Problem Relation Age of Onset  . Diabetes Maternal Grandfather   . Breast cancer Neg Hx   . Ovarian cancer Neg Hx    No current facility-administered medications on file prior to encounter.    Current Outpatient Prescriptions on File Prior to Encounter  Medication Sig Dispense Refill  . cephALEXin (KEFLEX) 500 MG capsule Take 1 capsule (500 mg total) by mouth 2 (two) times daily. 14 capsule 0  . ondansetron (ZOFRAN ODT) 4 MG disintegrating tablet Take 1 tablet (4 mg total) by mouth every 6 (six) hours as needed for nausea. (Patient not taking: Reported on 07/26/2016) 20 tablet 0  . Prenatal-DSS-FeCb-FeGl-FA (CITRANATAL BLOOM) 90-1 MG TABS Take 1 tablet by mouth daily. 30 tablet 11    Exam: BP 107/55 Temp: 98.4 Pulse:91  General: Petite BF, gravid, grimacing at times Heart: RRR without murmur Lungs: CTA Abdomen: NABS x4, soft, not tympanic, non tender to palpation, uterus NT, no contractions palpated Scar in lower abdomen NT FHR; 140s with accelerations to 150s to 160s, moderate variability Toco: uterine irritability only Vulva: no lesions or inflammation Vagina: no masses, no bleeding. FFN done Cervix: 0.5/50%/-3 (no change) Extremities: no edema, no redness of calves Neuro: alert, awake, oriented and answering questions appropriately Psyche: mood and affect normal  Results  for orders placed or performed during the hospital encounter of 07/27/16 (from the past 24 hour(s))  CBC     Status: Abnormal   Collection Time: 07/28/16 12:17 AM  Result Value Ref Range   WBC 16.8 (H) 3.6 - 11.0 K/uL   RBC 3.75 (L) 3.80 - 5.20 MIL/uL   Hemoglobin 8.6 (L) 12.0 - 16.0 g/dL   HCT 16.126.5 (L) 09.635.0 - 04.547.0 %   MCV 70.5 (L) 80.0 - 100.0 fL   MCH 22.8 (L) 26.0 - 34.0 pg   MCHC 32.3 32.0 - 36.0 g/dL   RDW 40.917.0 (H) 81.111.5 - 91.414.5 %   Platelets 285 150 -  440 K/uL    A: Third admission in the past week for  abdominal pain in pregnancy at 27wk3 days and  elevated WBC (last WBC on 6/1 was 11.2K)-concern for possible appendicitis No evidence of preterm labor Possible intestinal cramping from Keflex? FWB: Cat 1 tracing  P: MRI of abdomen and pelvis FFN NPO for now Consulted Dr Tiburcio PeaHarris regarding POM.  Farrel Connersolleen Lareen Mullings, CNM

## 2016-07-28 NOTE — Final Progress Note (Signed)
Physician Final Progress Note  Patient ID: Amanda Welch MRN: [id number] DOB/AGE: 23/08/1995 23 y.o.  Admit date: 07/27/2016 Admitting provider: Nadara Mustardobert P Harris, MD Discharge date: 07/28/2016   Admission Diagnoses:  1) intrauterine pregnancy at 10932w3d 2) abdominal pain in pregnancy, third trimester  Discharge Diagnoses:  1) intrauterine pregnancy at 2732w3d 2) abdominal pain in pregnancy, third trimester  History of Present Illness: The patient is a 21 y.o. female G2P1001 at 6032w3d by a 9 week ultrasound whose pregnancy has been complicated by a history of cesarean section in G1, GBS bacteruria, limited prenatal care, recurrent abdominal pain who presents for the third time in 4 days with continued abdominal pain.  She presented two days ago and was treated for preterm labor (0.5cm dilated) with IV fluid, terbutaline, and IM morphine. She also received BMTZ dose #1.  She presents with mild nausea. She was having normal bowel movements. She denies diarrhea and constipation.  Denies fevers and chills. When she arrived she rated her abdominal pain as 10/10.  She described the pain as a "tightening."  Denies vaginal bleeding or leaking fluid.  She noted +FM.  The abdominal pain was described as constant, but worsens with tightening.  She was treated on 6/1 after presenting with similar complaints with keflex for GBS bacteruria (8,000 cfu/ml). She has also been diagnosed with anemia this pregnancy.  Hospital Course:  The patient was monitored overnight with some uterine irritability noted initially, later resolving.  Fetal .  Heart rate tracing reassuring given gestational age.  Lab work (CBC, CMP) notable for slight increase in her WBC count (11k - 16.8k)  Hemoglobin dropped from 9.4 - 8.6.  She has had testing for gonorrhea and chlamydia which was negative. Wet prep negative.  Urine culture from 6/1 with GBS as previously noted.  Urine culture from 6/4 was negative. Patient states she has been taking  keflex.  Given abdominal pain with no obvious source (abdomen non-tender), no respiratory symptoms, no GI symptoms, no point tenderness over uterine scar, MRI performed that was negative for appendicitis (appendix not well visualized, but no surrounding reactive tissue changes).  Mild right hydroureteronephrosis noted (as expected in pregnancy).  Given that patient had no source for her pain, her exam was unremarkable, and all testing had been reassuring, her cervical exam was unchanged from 6/1 (so, no active labor), she was discharge with precautions. All results were reviewed in detail with patient. Given her rise in WBC count, it is possible she is developing an infection somewhere in her body that has not yet manifested physically in any other way apart from her abdominal pain.  This was discussed with her.  A lipase was not checked, but her pancreas appeared normal on MRI and she had mild pain and was tolerating a regular diet.  Patient voiced understanding of all the findings of all the testing performed and the clinical exam was reassuring. She was discharged after receiving her second dose of BMTZ. She has voiced on every visit to the hospital that [facility] An appointment has been arranged for her for 07/29/16, of which she is aware of the time and location.  This gives her follow up one day after discharge from the hospital.      Past Medical History:  Diagnosis Date  . Anemia   . Preeclampsia    with G1    Past Surgical History:  Procedure Laterality Date  . CESAREAN SECTION  06/26/2013    No  current facility-administered medications on file prior to encounter.    Current Outpatient Prescriptions on File Prior to Encounter  Medication Sig Dispense Refill  . cephALEXin (KEFLEX) 500 MG capsule Take 1 capsule (500 mg total) by mouth 2 (two) times daily. 14 capsule 0  . ondansetron (ZOFRAN ODT) 4 MG disintegrating tablet Take 1 tablet (4 mg total) by  mouth every 6 (six) hours as needed for nausea. (Patient not taking: Reported on 07/26/2016) 20 tablet 0  . Prenatal-DSS-FeCb-FeGl-FA (CITRANATAL BLOOM) 90-1 MG TABS Take 1 tablet by mouth daily. 30 tablet 11    No Known Allergies  Social History   Social History  . Marital status: Single    Spouse name: N/A  . Number of children: N/A  . Years of education: N/A   Occupational History  . Not on file.   Social History Main Topics  . Smoking status: Never Smoker  . Smokeless tobacco: Never Used  . Alcohol use No  . Drug use: No  . Sexual activity: Yes    Partners: Male   Other Topics Concern  . Not on file   Social History Narrative  . No narrative on file    Physical Exam: BP 105/63   Pulse 100   Temp 98.3 F (36.8 C) (Oral)   Resp 16   Physical Exam  Constitutional: She is oriented to person, place, and time. She appears well-developed and well-nourished. No distress.  HENT:  Head: Normocephalic and atraumatic.  Eyes: Conjunctivae are normal. No scleral icterus.  Neck: Normal range of motion. Neck supple.  Cardiovascular: Normal rate, regular rhythm and normal heart sounds.  Exam reveals no gallop and no friction rub.   No murmur heard. Pulmonary/Chest: Effort normal and breath sounds normal. She has no wheezes.  Abdominal: Soft. She exhibits no distension. There is no tenderness. There is no rebound and no guarding. Hernia confirmed negative in the right inguinal area and confirmed negative in the left inguinal area.  Gravid uterus above umbilicus  Genitourinary: Pelvic exam was performed with patient supine. There is no rash, tenderness or lesion on the right labia. There is no rash, tenderness or lesion on the left labia.  Musculoskeletal: Normal range of motion.  Lymphadenopathy:       Right: No inguinal adenopathy present.       Left: No inguinal adenopathy present.  Neurological: She is alert and oriented to person, place, and time. No cranial nerve deficit.   Skin: Skin is warm and dry. No rash noted.  Psychiatric: She has a normal mood and affect. Her behavior is normal. Judgment normal.    Consults: None  Significant Findings/ Diagnostic Studies:   Recent Labs Lab 07/23/16 0944 07/28/16 0017  WBC 11.2* 16.8*  HGB 9.4* 8.6*  PLT 297 285     Recent Labs Lab 07/23/16 0944  NA 136  K 3.9  CREATININE 0.41*    Lab Results  Component Value Date   APPEARANCEUR CLEAR (A) 07/26/2016   GLUCOSEU NEGATIVE 07/26/2016   BILIRUBINUR NEGATIVE 07/26/2016   KETONESUR NEGATIVE 07/26/2016   LABSPEC 1.020 07/26/2016   HGBUR NEGATIVE 07/26/2016   PHURINE 7.0 07/26/2016   NITRITE NEGATIVE 07/26/2016   LEUKOCYTESUR NEGATIVE 07/26/2016   RBCU 0-5 07/26/2016   WBCU 0-5 07/26/2016   BACTERIA NONE SEEN 07/26/2016   EPIU 0-5 (A) 07/26/2016   MUCOUSUACOMP PRESENT 07/26/2016      Recent Labs     07/26/16  2234  Urine Culture  NO GROWTH Performed at [facility]  [facility], [address], [place], [place] [address]     Lab Results  Component Value Date   CHLAMYDIA NOT DETECTED 07/23/2016   NGONORRHOEAE NOT DETECTED 07/23/2016   Mr Pelvis Wo Contrast  Result Date: 07/28/2016 CLINICAL DATA:  Abdominal pain for 1 week. [redacted] weeks pregnant. Clinical suspicion for appendicitis. EXAM: MRI ABDOMEN AND PELVIS WITHOUT CONTRAST TECHNIQUE: Multiplanar multisequence MR imaging of the abdomen and pelvis was performed. No intravenous contrast was administered. COMPARISON:  None. FINDINGS: COMBINED FINDINGS FOR BOTH MR ABDOMEN AND PELVIS Lower chest: No acute findings. Hepatobiliary: No masses visualized on this unenhanced exam. Gallbladder is unremarkable. No evidence of biliary ductal dilatation. Pancreas: No mass or inflammatory process visualized on this unenhanced exam. Spleen:  Within normal limits in size. Adrenals/Urinary tract: No renal masses identified. Moderate right hydroureteronephrosis is seen due to compression by the gravid uterus at the pelvic  brim. Stomach/Bowel: No evidence of obstruction, inflammatory process, or abnormal fluid collections. Appendix is not well visualized, however no inflammatory process is identified in the right lower quadrant or area the cecum. Vascular/Lymphatic: No pathologically enlarged lymph nodes identified. No evidence of abdominal aortic aneurysm. Reproductive: Single intrauterine fetus is seen in cephalic presentation. Anterior placenta. No evidence of previa. No pelvic or adnexal mass identified. No evidence of free fluid. Other:  None. Musculoskeletal:  No suspicious bone lesions identified. IMPRESSION: Moderate right hydronephrosis of pregnancy. No definite evidence of appendicitis. Electronically Signed   By: Myles Rosenthal M.D.   On: 07/28/2016 07:25   Mr Abdomen Wo Contrast  Result Date: 07/28/2016 CLINICAL DATA:  Abdominal pain for 1 week. [redacted] weeks pregnant. Clinical suspicion for appendicitis. EXAM: MRI ABDOMEN AND PELVIS WITHOUT CONTRAST TECHNIQUE: Multiplanar multisequence MR imaging of the abdomen and pelvis was performed. No intravenous contrast was administered. COMPARISON:  None. FINDINGS: COMBINED FINDINGS FOR BOTH MR ABDOMEN AND PELVIS Lower chest: No acute findings. Hepatobiliary: No masses visualized on this unenhanced exam. Gallbladder is unremarkable. No evidence of biliary ductal dilatation. Pancreas: No mass or inflammatory process visualized on this unenhanced exam. Spleen:  Within normal limits in size. Adrenals/Urinary tract: No renal masses identified. Moderate right hydroureteronephrosis is seen due to compression by the gravid uterus at the pelvic brim. Stomach/Bowel: No evidence of obstruction, inflammatory process, or abnormal fluid collections. Appendix is not well visualized, however no inflammatory process is identified in the right lower quadrant or area the cecum. Vascular/Lymphatic: No pathologically enlarged lymph nodes identified. No evidence of abdominal aortic aneurysm. Reproductive:  Single intrauterine fetus is seen in cephalic presentation. Anterior placenta. No evidence of previa. No pelvic or adnexal mass identified. No evidence of free fluid. Other:  None. Musculoskeletal:  No suspicious bone lesions identified. IMPRESSION: Moderate right hydronephrosis of pregnancy. No definite evidence of appendicitis. Electronically Signed   By: Myles Rosenthal M.D.   On: 07/28/2016 07:25   Procedures: NST Baseline FHR: 135 beats/min Variability: moderate Accelerations: present Decelerations: absent Tocometry: irritabilty  Interpretation:  INDICATIONS: abdominal pain in pregnancy, preterm labor RESULTS:  A NST procedure was performed with FHR monitoring and a normal baseline established, appropriate time of 20-40 minutes of evaluation, and accels >2 seen w 10x10 characteristics.  Results show a REACTIVE NST.    Discharge Condition: stable  Disposition: 01-Home or Self Care  Diet: Regular diet  Discharge Activity: Ambulate in house   Allergies as of 07/28/2016   No Known Allergies     Medication List    STOP taking these medications   cephALEXin  500 MG capsule Commonly known as:  KEFLEX   ondansetron 4 MG disintegrating tablet Commonly known as:  ZOFRAN ODT     TAKE these medications   CITRANATAL BLOOM 90-1 MG Tabs Take 1 tablet by mouth daily.   ferrous sulfate 325 (65 FE) MG tablet Take 1 tablet (325 mg total) by mouth 2 (two) times daily with a meal.      Follow-up Information    [facility] Follow up in 1 day(s).   Why:  establish prenatal care (appointment already scheduled by L&D staff) Contact information: [address] [place] [address] [phone]          Total time spent taking care of this patient: 30 minutes  Signed: Thomasene Mohair, MD  07/28/2016, 9:04 AM

## 2016-07-28 NOTE — Discharge Summary (Signed)
See final progress note. 

## 2016-08-07 DIAGNOSIS — O99013 Anemia complicating pregnancy, third trimester: Secondary | ICD-10-CM | POA: Insufficient documentation

## 2016-08-07 NOTE — Progress Notes (Deleted)
[facility]  Telephone:(336) [phone] Fax:(336) [phone]  ID: Amanda Welch OB: August 25, 1995  MR#: [id number]  AOZ#:[id number]  Patient Care Team: System, Pcp Not In as PCP - General  CHIEF COMPLAINT: Anemia affecting pregnancy in the third trimester.  INTERVAL HISTORY: ***  REVIEW OF SYSTEMS:   ROS  As per HPI. Otherwise, a complete review of systems is negative.  PAST MEDICAL HISTORY: Past Medical History:  Diagnosis Date  . Anemia   . Preeclampsia    with G1    PAST SURGICAL HISTORY: Past Surgical History:  Procedure Laterality Date  . CESAREAN SECTION  06/26/2013    FAMILY HISTORY: Family History  Problem Relation Age of Onset  . Diabetes Maternal Grandfather   . Breast cancer Neg Hx   . Ovarian cancer Neg Hx     ADVANCED DIRECTIVES (Y/N):  N  HEALTH MAINTENANCE: Social History  Substance Use Topics  . Smoking status: Never Smoker  . Smokeless tobacco: Never Used  . Alcohol use No     Colonoscopy:  PAP:  Bone density:  Lipid panel:  No Known Allergies  Current Outpatient Prescriptions  Medication Sig Dispense Refill  . ferrous sulfate 325 (65 FE) MG tablet Take 1 tablet (325 mg total) by mouth 2 (two) times daily with a meal.  3  . Prenatal-DSS-FeCb-FeGl-FA (CITRANATAL BLOOM) 90-1 MG TABS Take 1 tablet by mouth daily. 30 tablet 11   No current facility-administered medications for this visit.     OBJECTIVE: There were no vitals filed for this visit.   There is no height or weight on file to calculate BMI.    ECOG FS:{CHL ONC [id number]  General: Well-developed, well-nourished, no acute distress. Eyes: Pink conjunctiva, anicteric sclera. HEENT: Normocephalic, moist mucous membranes, clear oropharnyx. Lungs: Clear to auscultation bilaterally. Heart: Regular rate and rhythm. No rubs, murmurs, or gallops. Abdomen: Soft, nontender, nondistended. No organomegaly noted, normoactive bowel sounds. Musculoskeletal: No edema,  cyanosis, or clubbing. Neuro: Alert, answering all questions appropriately. Cranial nerves grossly intact. Skin: No rashes or petechiae noted. Psych: Normal affect. Lymphatics: No cervical, calvicular, axillary or inguinal LAD.   LAB RESULTS:  Lab Results  Component Value Date   NA 136 07/23/2016   K 3.9 07/23/2016   CL 108 07/23/2016   CO2 22 07/23/2016   GLUCOSE 81 07/23/2016   BUN 9 07/23/2016   CREATININE 0.41 (L) 07/23/2016   CALCIUM 8.3 (L) 07/23/2016   GFRNONAA >60 07/23/2016   GFRAA >60 07/23/2016    Lab Results  Component Value Date   WBC 16.8 (H) 07/28/2016   HGB 8.6 (L) 07/28/2016   HCT 26.5 (L) 07/28/2016   MCV 70.5 (L) 07/28/2016   PLT 285 07/28/2016     STUDIES: Mr Pelvis Wo Contrast  Result Date: 07/28/2016 CLINICAL DATA:  Abdominal pain for 1 week. [redacted] weeks pregnant. Clinical suspicion for appendicitis. EXAM: MRI ABDOMEN AND PELVIS WITHOUT CONTRAST TECHNIQUE: Multiplanar multisequence MR imaging of the abdomen and pelvis was performed. No intravenous contrast was administered. COMPARISON:  None. FINDINGS: COMBINED FINDINGS FOR BOTH MR ABDOMEN AND PELVIS Lower chest: No acute findings. Hepatobiliary: No masses visualized on this unenhanced exam. Gallbladder is unremarkable. No evidence of biliary ductal dilatation. Pancreas: No mass or inflammatory process visualized on this unenhanced exam. Spleen:  Within normal limits in size. Adrenals/Urinary tract: No renal masses identified. Moderate right hydroureteronephrosis is seen due to compression by the gravid uterus at the pelvic brim. Stomach/Bowel: No evidence of obstruction, inflammatory process, or abnormal fluid collections.  Appendix is not well visualized, however no inflammatory process is identified in the right lower quadrant or area the cecum. Vascular/Lymphatic: No pathologically enlarged lymph nodes identified. No evidence of abdominal aortic aneurysm. Reproductive: Single intrauterine fetus is seen in  cephalic presentation. Anterior placenta. No evidence of previa. No pelvic or adnexal mass identified. No evidence of free fluid. Other:  None. Musculoskeletal:  No suspicious bone lesions identified. IMPRESSION: Moderate right hydronephrosis of pregnancy. No definite evidence of appendicitis. Electronically Signed   By: Myles RosenthalJohn  Stahl M.D.   On: 07/28/2016 07:25   Mr Abdomen Wo Contrast  Result Date: 07/28/2016 CLINICAL DATA:  Abdominal pain for 1 week. [redacted] weeks pregnant. Clinical suspicion for appendicitis. EXAM: MRI ABDOMEN AND PELVIS WITHOUT CONTRAST TECHNIQUE: Multiplanar multisequence MR imaging of the abdomen and pelvis was performed. No intravenous contrast was administered. COMPARISON:  None. FINDINGS: COMBINED FINDINGS FOR BOTH MR ABDOMEN AND PELVIS Lower chest: No acute findings. Hepatobiliary: No masses visualized on this unenhanced exam. Gallbladder is unremarkable. No evidence of biliary ductal dilatation. Pancreas: No mass or inflammatory process visualized on this unenhanced exam. Spleen:  Within normal limits in size. Adrenals/Urinary tract: No renal masses identified. Moderate right hydroureteronephrosis is seen due to compression by the gravid uterus at the pelvic brim. Stomach/Bowel: No evidence of obstruction, inflammatory process, or abnormal fluid collections. Appendix is not well visualized, however no inflammatory process is identified in the right lower quadrant or area the cecum. Vascular/Lymphatic: No pathologically enlarged lymph nodes identified. No evidence of abdominal aortic aneurysm. Reproductive: Single intrauterine fetus is seen in cephalic presentation. Anterior placenta. No evidence of previa. No pelvic or adnexal mass identified. No evidence of free fluid. Other:  None. Musculoskeletal:  No suspicious bone lesions identified. IMPRESSION: Moderate right hydronephrosis of pregnancy. No definite evidence of appendicitis. Electronically Signed   By: Myles RosenthalJohn  Stahl M.D.   On:  07/28/2016 07:25    ASSESSMENT: Anemia affecting pregnancy in the third trimester.  PLAN:    1. Anemia affecting pregnancy in the third trimester:  Patient expressed understanding and was in agreement with this plan. She also understands that She can call clinic at any time with any questions, concerns, or complaints.   Cancer Staging No matching staging information was found for the patient.  Jeralyn Ruthsimothy J Mindel Friscia, MD   08/07/2016 9:06 PM

## 2016-08-09 ENCOUNTER — Inpatient Hospital Stay: Payer: Medicaid Other | Admitting: Oncology

## 2016-08-22 NOTE — Progress Notes (Signed)
[facility]  Telephone:(336) [phone] Fax:(336) [phone]  ID: Amanda Welch OB: 06/27/1995  MR#: [id number]  AOZ#:[id number]  Patient Care Team: System, Pcp Not In as PCP - General  CHIEF COMPLAINT: Anemia affecting pregnancy in the third trimester  INTERVAL HISTORY: Patient is a 21 year old female who was found to have a declining hemoglobin data third trimester pregnancy. She currently feels well and is asymptomatic. She does not complain of weakness or fatigue. She has no neurologic complaints. She denies any recent fevers or illnesses. She is gaining weight appropriately. She denies any chest pain or shortness of breath. She has no nausea, vomiting, constipation, or diarrhea. She has no urinary complaints. Patient offers no specific complaints today.  REVIEW OF SYSTEMS:   Review of Systems  Constitutional: Negative.  Negative for fever, malaise/fatigue and weight loss.  Respiratory: Negative.  Negative for cough and shortness of breath.   Cardiovascular: Negative.  Negative for chest pain and leg swelling.  Gastrointestinal: Negative.  Negative for abdominal pain, blood in stool and melena.  Genitourinary: Negative.   Musculoskeletal: Negative.   Skin: Negative.  Negative for rash.  Neurological: Negative.  Negative for sensory change and weakness.  Psychiatric/Behavioral: Negative.  The patient is not nervous/anxious.     As per HPI. Otherwise, a complete review of systems is negative.  PAST MEDICAL HISTORY: Past Medical History:  Diagnosis Date  . Anemia   . Preeclampsia    with G1    PAST SURGICAL HISTORY: Past Surgical History:  Procedure Laterality Date  . CESAREAN SECTION  06/26/2013    FAMILY HISTORY: Family History  Problem Relation Age of Onset  . Diabetes Maternal Grandfather   . Breast cancer Neg Hx   . Ovarian cancer Neg Hx     ADVANCED DIRECTIVES (Y/N):  N  HEALTH MAINTENANCE: Social History  Substance Use Topics  . Smoking  status: Never Smoker  . Smokeless tobacco: Never Used  . Alcohol use No     Colonoscopy:  PAP:  Bone density:  Lipid panel:  No Known Allergies  Current Outpatient Prescriptions  Medication Sig Dispense Refill  . ferrous sulfate 325 (65 FE) MG tablet Take 1 tablet (325 mg total) by mouth 2 (two) times daily with a meal.  3  . Prenatal-DSS-FeCb-FeGl-FA (CITRANATAL BLOOM) 90-1 MG TABS Take 1 tablet by mouth daily. 30 tablet 11   No current facility-administered medications for this visit.     OBJECTIVE: Vitals:   08/23/16 1059  BP: 104/68  Pulse: 94  Resp: 20  Temp: 97.9 F (36.6 C)     Body mass index is 24.14 kg/m.    ECOG FS:0 - Asymptomatic  General: Well-developed, well-nourished, no acute distress. Eyes: Pink conjunctiva, anicteric sclera. HEENT: Normocephalic, moist mucous membranes, clear oropharnyx. Lungs: Clear to auscultation bilaterally. Heart: Regular rate and rhythm. No rubs, murmurs, or gallops. Abdomen: Appears appropriate for gestational age. Musculoskeletal: No edema, cyanosis, or clubbing. Neuro: Alert, answering all questions appropriately. Cranial nerves grossly intact. Skin: No rashes or petechiae noted. Psych: Normal affect. Lymphatics: No cervical, calvicular, axillary or inguinal LAD.   LAB RESULTS:  Lab Results  Component Value Date   NA 136 07/23/2016   K 3.9 07/23/2016   CL 108 07/23/2016   CO2 22 07/23/2016   GLUCOSE 81 07/23/2016   BUN 9 07/23/2016   CREATININE 0.41 (L) 07/23/2016   CALCIUM 8.3 (L) 07/23/2016   GFRNONAA >60 07/23/2016   GFRAA >60 07/23/2016    Lab Results  Component Value  Date   WBC 10.6 08/23/2016   NEUTROABS 7.3 (H) 08/23/2016   HGB 9.2 (L) 08/23/2016   HCT 28.2 (L) 08/23/2016   MCV 71.2 (L) 08/23/2016   PLT 260 08/23/2016   Lab Results  Component Value Date   IRON 26 (L) 08/23/2016   TIBC 538 (H) 08/23/2016   IRONPCTSAT 5 (L) 08/23/2016   Lab Results  Component Value Date   FERRITIN 6 (L)  08/23/2016     STUDIES: Mr Pelvis Wo Contrast  Result Date: 07/28/2016 CLINICAL DATA:  Abdominal pain for 1 week. [redacted] weeks pregnant. Clinical suspicion for appendicitis. EXAM: MRI ABDOMEN AND PELVIS WITHOUT CONTRAST TECHNIQUE: Multiplanar multisequence MR imaging of the abdomen and pelvis was performed. No intravenous contrast was administered. COMPARISON:  None. FINDINGS: COMBINED FINDINGS FOR BOTH MR ABDOMEN AND PELVIS Lower chest: No acute findings. Hepatobiliary: No masses visualized on this unenhanced exam. Gallbladder is unremarkable. No evidence of biliary ductal dilatation. Pancreas: No mass or inflammatory process visualized on this unenhanced exam. Spleen:  Within normal limits in size. Adrenals/Urinary tract: No renal masses identified. Moderate right hydroureteronephrosis is seen due to compression by the gravid uterus at the pelvic brim. Stomach/Bowel: No evidence of obstruction, inflammatory process, or abnormal fluid collections. Appendix is not well visualized, however no inflammatory process is identified in the right lower quadrant or area the cecum. Vascular/Lymphatic: No pathologically enlarged lymph nodes identified. No evidence of abdominal aortic aneurysm. Reproductive: Single intrauterine fetus is seen in cephalic presentation. Anterior placenta. No evidence of previa. No pelvic or adnexal mass identified. No evidence of free fluid. Other:  None. Musculoskeletal:  No suspicious bone lesions identified. IMPRESSION: Moderate right hydronephrosis of pregnancy. No definite evidence of appendicitis. Electronically Signed   By: Myles Rosenthal M.D.   On: 07/28/2016 07:25   Mr Abdomen Wo Contrast  Result Date: 07/28/2016 CLINICAL DATA:  Abdominal pain for 1 week. [redacted] weeks pregnant. Clinical suspicion for appendicitis. EXAM: MRI ABDOMEN AND PELVIS WITHOUT CONTRAST TECHNIQUE: Multiplanar multisequence MR imaging of the abdomen and pelvis was performed. No intravenous contrast was administered.  COMPARISON:  None. FINDINGS: COMBINED FINDINGS FOR BOTH MR ABDOMEN AND PELVIS Lower chest: No acute findings. Hepatobiliary: No masses visualized on this unenhanced exam. Gallbladder is unremarkable. No evidence of biliary ductal dilatation. Pancreas: No mass or inflammatory process visualized on this unenhanced exam. Spleen:  Within normal limits in size. Adrenals/Urinary tract: No renal masses identified. Moderate right hydroureteronephrosis is seen due to compression by the gravid uterus at the pelvic brim. Stomach/Bowel: No evidence of obstruction, inflammatory process, or abnormal fluid collections. Appendix is not well visualized, however no inflammatory process is identified in the right lower quadrant or area the cecum. Vascular/Lymphatic: No pathologically enlarged lymph nodes identified. No evidence of abdominal aortic aneurysm. Reproductive: Single intrauterine fetus is seen in cephalic presentation. Anterior placenta. No evidence of previa. No pelvic or adnexal mass identified. No evidence of free fluid. Other:  None. Musculoskeletal:  No suspicious bone lesions identified. IMPRESSION: Moderate right hydronephrosis of pregnancy. No definite evidence of appendicitis. Electronically Signed   By: Myles Rosenthal M.D.   On: 07/28/2016 07:25    ASSESSMENT: Anemia affecting pregnancy in the third trimester.  PLAN:    1. Anemia affecting pregnancy in the third trimester: Patient's hemoglobin and iron stores are noted to be significantly decreased. She will return to clinic in 1 and 2 weeks to receive 510 mg IV Feraheme. She will then return to clinic in mid August for repeat laboratory work  and further evaluation. If patient's hemoglobin does not improve with treatment, will consider for iron workup at that time. 2. Pregnancy: Patient reports she is having a schedule C-section at the end of August, but is unclear to date. Continue treatment and observation per OB/GYN.  Approximately 45 minutes was spent  in discussion of which greater than 50% was consultation.  Patient expressed understanding and was in agreement with this plan. She also understands that She can call clinic at any time with any questions, concerns, or complaints.    Jeralyn Ruthsimothy J Clevester Helzer, MD   08/23/2016 5:05 PM

## 2016-08-23 ENCOUNTER — Encounter (INDEPENDENT_AMBULATORY_CARE_PROVIDER_SITE_OTHER): Payer: Self-pay

## 2016-08-23 ENCOUNTER — Inpatient Hospital Stay: Payer: Medicaid Other | Attending: Oncology | Admitting: Oncology

## 2016-08-23 ENCOUNTER — Inpatient Hospital Stay: Payer: Medicaid Other

## 2016-08-23 VITALS — BP 104/68 | HR 94 | Temp 97.9°F | Resp 20 | Wt 119.5 lb

## 2016-08-23 DIAGNOSIS — O99013 Anemia complicating pregnancy, third trimester: Secondary | ICD-10-CM

## 2016-08-23 DIAGNOSIS — N1330 Unspecified hydronephrosis: Secondary | ICD-10-CM | POA: Diagnosis not present

## 2016-08-23 DIAGNOSIS — Z79899 Other long term (current) drug therapy: Secondary | ICD-10-CM | POA: Diagnosis not present

## 2016-08-23 DIAGNOSIS — O99012 Anemia complicating pregnancy, second trimester: Secondary | ICD-10-CM | POA: Diagnosis not present

## 2016-08-23 LAB — IRON AND TIBC
Iron: 26 ug/dL — ABNORMAL LOW (ref 28–170)
Saturation Ratios: 5 % — ABNORMAL LOW (ref 10.4–31.8)
TIBC: 538 ug/dL — ABNORMAL HIGH (ref 250–450)
UIBC: 512 ug/dL

## 2016-08-23 LAB — CBC WITH DIFFERENTIAL/PLATELET
Basophils Absolute: 0.1 10*3/uL (ref 0–0.1)
Basophils Relative: 1 %
Eosinophils Absolute: 0.1 10*3/uL (ref 0–0.7)
Eosinophils Relative: 1 %
HEMATOCRIT: 28.2 % — AB (ref 35.0–47.0)
HEMOGLOBIN: 9.2 g/dL — AB (ref 12.0–16.0)
LYMPHS ABS: 2.1 10*3/uL (ref 1.0–3.6)
LYMPHS PCT: 20 %
MCH: 23.3 pg — AB (ref 26.0–34.0)
MCHC: 32.7 g/dL (ref 32.0–36.0)
MCV: 71.2 fL — AB (ref 80.0–100.0)
MONO ABS: 1.1 10*3/uL — AB (ref 0.2–0.9)
MONOS PCT: 11 %
NEUTROS ABS: 7.3 10*3/uL — AB (ref 1.4–6.5)
NEUTROS PCT: 67 %
Platelets: 260 10*3/uL (ref 150–440)
RBC: 3.97 MIL/uL (ref 3.80–5.20)
RDW: 18.9 % — AB (ref 11.5–14.5)
WBC: 10.6 10*3/uL (ref 3.6–11.0)

## 2016-08-23 LAB — FOLATE: FOLATE: 11.8 ng/mL (ref >5.9)

## 2016-08-23 LAB — FERRITIN: Ferritin: 6 ng/mL — ABNORMAL LOW (ref 11–307)

## 2016-08-23 LAB — VITAMIN B12: VITAMIN B 12: 166 pg/mL — AB (ref 180–914)

## 2016-08-23 NOTE — Progress Notes (Signed)
Patient here today for new evaluation regarding anemia in pregnancy.  

## 2016-09-03 ENCOUNTER — Inpatient Hospital Stay: Payer: Medicaid Other

## 2016-09-03 VITALS — BP 96/91 | HR 102 | Temp 98.5°F | Resp 16

## 2016-09-03 DIAGNOSIS — N1330 Unspecified hydronephrosis: Secondary | ICD-10-CM | POA: Diagnosis not present

## 2016-09-03 DIAGNOSIS — O99013 Anemia complicating pregnancy, third trimester: Secondary | ICD-10-CM

## 2016-09-03 MED ORDER — SODIUM CHLORIDE 0.9 % IV SOLN
510.0000 mg | Freq: Once | INTRAVENOUS | Status: AC
  Administered 2016-09-03: 510 mg via INTRAVENOUS
  Filled 2016-09-03: qty 17

## 2016-09-03 MED ORDER — SODIUM CHLORIDE 0.9 % IV SOLN
Freq: Once | INTRAVENOUS | Status: AC
  Administered 2016-09-03: 14:00:00 via INTRAVENOUS
  Filled 2016-09-03: qty 1000

## 2016-09-10 ENCOUNTER — Inpatient Hospital Stay: Payer: Medicaid Other

## 2016-09-10 VITALS — BP 100/68 | HR 102 | Temp 97.0°F | Resp 18

## 2016-09-10 DIAGNOSIS — O99013 Anemia complicating pregnancy, third trimester: Secondary | ICD-10-CM

## 2016-09-10 DIAGNOSIS — N1330 Unspecified hydronephrosis: Secondary | ICD-10-CM | POA: Diagnosis not present

## 2016-09-10 MED ORDER — SODIUM CHLORIDE 0.9 % IV SOLN
Freq: Once | INTRAVENOUS | Status: AC
  Administered 2016-09-10: 15:00:00 via INTRAVENOUS
  Filled 2016-09-10: qty 1000

## 2016-09-10 MED ORDER — FERUMOXYTOL INJECTION 510 MG/17 ML
510.0000 mg | Freq: Once | INTRAVENOUS | Status: AC
Start: 2016-09-10 — End: 2016-09-10
  Administered 2016-09-10: 510 mg via INTRAVENOUS
  Filled 2016-09-10: qty 17

## 2016-09-14 ENCOUNTER — Observation Stay
Admission: EM | Admit: 2016-09-14 | Discharge: 2016-09-14 | Disposition: A | Discharge status: Home / Self Care | Payer: Medicaid Other | Attending: Obstetrics and Gynecology | Admitting: Obstetrics and Gynecology

## 2016-09-14 DIAGNOSIS — R112 Nausea with vomiting, unspecified: Secondary | ICD-10-CM | POA: Diagnosis not present

## 2016-09-14 DIAGNOSIS — O0932 Supervision of pregnancy with insufficient antenatal care, second trimester: Secondary | ICD-10-CM

## 2016-09-14 DIAGNOSIS — O34219 Maternal care for unspecified type scar from previous cesarean delivery: Secondary | ICD-10-CM

## 2016-09-14 DIAGNOSIS — Z3A34 34 weeks gestation of pregnancy: Secondary | ICD-10-CM | POA: Insufficient documentation

## 2016-09-14 DIAGNOSIS — O9989 Other specified diseases and conditions complicating pregnancy, childbirth and the puerperium: Secondary | ICD-10-CM | POA: Diagnosis present

## 2016-09-14 LAB — CBC
HEMATOCRIT: 31 % — AB (ref 35.0–47.0)
Hemoglobin: 10 g/dL — ABNORMAL LOW (ref 12.0–16.0)
MCH: 24.1 pg — AB (ref 26.0–34.0)
MCHC: 32.2 g/dL (ref 32.0–36.0)
MCV: 75 fL — AB (ref 80.0–100.0)
PLATELETS: 261 10*3/uL (ref 150–440)
RBC: 4.13 MIL/uL (ref 3.80–5.20)
RDW: 22.6 % — AB (ref 11.5–14.5)
WBC: 9.6 10*3/uL (ref 3.6–11.0)

## 2016-09-14 LAB — COMPREHENSIVE METABOLIC PANEL
ALT: 9 U/L — AB (ref 14–54)
AST: 19 U/L (ref 15–41)
Albumin: 2.9 g/dL — ABNORMAL LOW (ref 3.5–5.0)
Alkaline Phosphatase: 110 U/L (ref 38–126)
Anion gap: 5 (ref 5–15)
BUN: 6 mg/dL (ref 6–20)
CHLORIDE: 107 mmol/L (ref 101–111)
CO2: 24 mmol/L (ref 22–32)
CREATININE: 0.57 mg/dL (ref 0.44–1.00)
Calcium: 8.6 mg/dL — ABNORMAL LOW (ref 8.9–10.3)
GFR calc Af Amer: >60 mL/min (ref >60)
GFR calc non Af Amer: >60 mL/min (ref >60)
Glucose, Bld: 193 mg/dL — ABNORMAL HIGH (ref 65–99)
POTASSIUM: 4.4 mmol/L (ref 3.5–5.1)
SODIUM: 136 mmol/L (ref 135–145)
Total Bilirubin: 0.4 mg/dL (ref 0.3–1.2)
Total Protein: 6 g/dL — ABNORMAL LOW (ref 6.5–8.1)

## 2016-09-14 LAB — URINALYSIS, ROUTINE W REFLEX MICROSCOPIC
Bilirubin Urine: NEGATIVE
Hgb urine dipstick: NEGATIVE
KETONES UR: NEGATIVE mg/dL
Nitrite: NEGATIVE
PROTEIN: NEGATIVE mg/dL
Specific Gravity, Urine: 1.01 (ref 1.005–1.030)
pH: 7 (ref 5.0–8.0)

## 2016-09-14 MED ORDER — DEXTROSE IN LACTATED RINGERS 5 % IV SOLN
INTRAVENOUS | Status: DC
  Administered 2016-09-14: 15:00:00 via INTRAVENOUS

## 2016-09-14 MED ORDER — DEXTROSE IN LACTATED RINGERS 5 % IV SOLN
Freq: Once | INTRAVENOUS | Status: AC
  Administered 2016-09-14: 14:00:00 via INTRAVENOUS

## 2016-09-14 MED ORDER — PROMETHAZINE HCL 25 MG/ML IJ SOLN: 25.0000 mg | Freq: Four times a day (QID) | INTRAMUSCULAR | Status: DC | PRN

## 2016-09-14 MED ORDER — ONDANSETRON HCL 4 MG/2ML IJ SOLN
4.0000 mg | Freq: Once | INTRAMUSCULAR | Status: AC
  Administered 2016-09-14: 4 mg via INTRAVENOUS

## 2016-09-14 MED ORDER — ONDANSETRON HCL 4 MG/2ML IJ SOLN
INTRAMUSCULAR | Status: AC
  Filled 2016-09-14: qty 2

## 2016-09-14 NOTE — Discharge Summary (Signed)
Schermerhorn, Ihor Austin, MD  Obstetrics    [] Hide copied text [] Hover for attribution information Patient ID: Amanda Welch, female   DOB: [birth year], 21 y.o.   MRN: [id number]   Amanda Welch is a 21 y.o. female. She is at [redacted]w[redacted]d gestation. No LMP recorded. Patient is pregnant. Estimated Date of Delivery: 10/24/16 S: Pt c/o of cramping for 24 hrs and N/V for 48 hrs( emesis3-4x/ day) . Unable to keep anything down . Last pregnancy uncomplicated except BP issues . No LOF . No vag bleeding  Prenatal care site: [facility] Chief complaint:     Maternal Medical History:       Past Medical History:  Diagnosis Date  . Anemia   . Preeclampsia    with G1         Past Surgical History:  Procedure Laterality Date  . CESAREAN SECTION  06/26/2013    No Known Allergies         Prior to Admission medications   Medication Sig Start Date End Date Taking? Authorizing Provider  ferrous sulfate 325 (65 FE) MG tablet Take 1 tablet (325 mg total) by mouth 2 (two) times daily with a meal. 07/28/16  Yes Conard Novak, MD  Prenatal-DSS-FeCb-FeGl-FA (CITRANATAL BLOOM) 90-1 MG TABS Take 1 tablet by mouth daily. 07/23/16  Yes Farrel Conners, CNM     Social History: She  reports that she has never smoked. She has never used smokeless tobacco. She reports that she does not drink alcohol or use drugs.  Family History: family history includes Diabetes in her maternal grandfather.  no history of gyn cancers,   Review of Systems: GI : no hematochezia , no biliary colic Cv No chest pain  Pulm : no asthma  Gyn : no STD's , pelvic pain  Hematologic + Anemia  O:  BP 102/73 (BP Location: Left Arm)   Pulse (!) 113   Temp 98.1 F (36.7 C) (Oral)   Resp 18   Ht 4\' 11"  (1.499 m)   Wt 54 kg (119 lb)   SpO2 100%   BMI 24.04 kg/m       Results for orders placed or performed during the hospital encounter of 09/14/16 (from the past 48 hour(s))  Comprehensive metabolic  panel   Collection Time: 09/14/16  2:20 PM  Result Value Ref Range   Sodium 136 135 - 145 mmol/L   Potassium 4.4 3.5 - 5.1 mmol/L   Chloride 107 101 - 111 mmol/L   CO2 24 22 - 32 mmol/L   Glucose, Bld 193 (H) 65 - 99 mg/dL   BUN 6 6 - 20 mg/dL   Creatinine, Ser 4.09 0.44 - 1.00 mg/dL   Calcium 8.6 (L) 8.9 - 10.3 mg/dL   Total Protein 6.0 (L) 6.5 - 8.1 g/dL   Albumin 2.9 (L) 3.5 - 5.0 g/dL   AST 19 15 - 41 U/L   ALT 9 (L) 14 - 54 U/L   Alkaline Phosphatase 110 38 - 126 U/L   Total Bilirubin 0.4 0.3 - 1.2 mg/dL   GFR calc non Af Amer >60 >60 mL/min   GFR calc Af Amer >60 >60 mL/min   Anion gap 5 5 - 15  CBC   Collection Time: 09/14/16  2:20 PM  Result Value Ref Range   WBC 9.6 3.6 - 11.0 K/uL   RBC 4.13 3.80 - 5.20 MIL/uL   Hemoglobin 10.0 (L) 12.0 - 16.0 g/dL   HCT 81.1 (L) 91.4 - 78.2 %  MCV 75.0 (L) 80.0 - 100.0 fL   MCH 24.1 (L) 26.0 - 34.0 pg   MCHC 32.2 32.0 - 36.0 g/dL   RDW 16.122.6 (H) 09.611.5 - 04.514.5 %   Platelets 261 150 - 440 K/uL  Urinalysis, Routine w reflex microscopic   Collection Time: 09/14/16  2:54 PM  Result Value Ref Range   Color, Urine YELLOW (A) YELLOW   APPearance HAZY (A) CLEAR   Specific Gravity, Urine 1.010 1.005 - 1.030   pH 7.0 5.0 - 8.0   Glucose, UA >=500 (A) NEGATIVE mg/dL   Hgb urine dipstick NEGATIVE NEGATIVE   Bilirubin Urine NEGATIVE NEGATIVE   Ketones, ur NEGATIVE NEGATIVE mg/dL   Protein, ur NEGATIVE NEGATIVE mg/dL   Nitrite NEGATIVE NEGATIVE   Leukocytes, UA LARGE (A) NEGATIVE   RBC / HPF 0-5 0 - 5 RBC/hpf   WBC, UA 6-30 0 - 5 WBC/hpf   Bacteria, UA RARE (A) NONE SEEN   Squamous Epithelial / LPF 6-30 (A) NONE SEEN   Constitutional: NAD, AAOx3  HE/ENT: extraocular movements grossly intact, moist mucous membranes CV: RRR PULM: nl respiratory effort, CTABL                                         Abd: gravid, non-tender, non-distended, soft                                                    Ext: Non-tender, Nonedmeatous                     Psych: mood appropriate, speech normal Pelvic closed / 50 % / VTX  NST: Reactive   Baseline:  Variability: moderate Accelerations present x >2 Decelerations absent  uterine irritability    A/P: 21 y.o. 7676w2d here for antenatal surveillance for non specific N/V . No episodes since . Specific gravity disproves significant dehydration   Labor: not present.   Fetal Wellbeing: Reassuring Cat 1 tracing.  Reactive NST    2 Liters of IVF   D/c home stable, precautions reviewed, follow-up as scheduled.  Zofran 4 mg po q6 hrs , phenergan 25 mg po q 6 hrs prn  ----- Jennell Cornerhomas Schermerhorn  MD Attending Obstetrician and Gynecologist [facility], Department of OB/GYN [facility]     Electronically signed by Schermerhorn, Ihor Austinhomas J, MD at 09/14/2016 3:53 PM      ED to Hosp-Admission (Current) on 09/14/2016

## 2016-09-14 NOTE — Progress Notes (Addendum)
Patient ID: Amanda Welch Prickett, female   DOB: 12/11/1995, 21 y.o.   MRN: [id number]   Amanda Welch Eddings is a 21 y.o. female. She is at 6875w2d gestation. No LMP recorded. Patient is pregnant. Estimated Date of Delivery: 10/24/16 S: Pt c/o of cramping for 24 hrs and N/V for 48 hrs( emesis3-4x/ day) . Unable to keep anything down . Last pregnancy uncomplicated except BP issues . No LOF . No vag bleeding  [facility] Chief complaint:     Maternal Medical History:   Past Medical History:  Diagnosis Date  . Anemia   . Preeclampsia    with G1    Past Surgical History:  Procedure Laterality Date  . CESAREAN SECTION  06/26/2013    No Known Allergies  Prior to Admission medications   Medication Sig Start Date End Date Taking? Authorizing Provider  ferrous sulfate 325 (65 FE) MG tablet Take 1 tablet (325 mg total) by mouth 2 (two) times daily with a meal. 07/28/16  Yes Conard NovakJackson, Stephen D, MD  Prenatal-DSS-FeCb-FeGl-FA (CITRANATAL BLOOM) 90-1 MG TABS Take 1 tablet by mouth daily. 07/23/16  Yes Farrel ConnersGutierrez, Colleen, CNM     Social History: She  reports that she has never smoked. She has never used smokeless tobacco. She reports that she does not drink alcohol or use drugs.  Family History: family history includes Diabetes in her maternal grandfather.  no history of gyn cancers,   Review of Systems: GI : no hematochezia , no biliary colic Cv No chest pain  Pulm : no asthma  Gyn : no STD's , pelvic pain  Hematologic + Anemia  O:  BP 102/73 (BP Location: Left Arm)   Pulse (!) 113   Temp 98.1 F (36.7 C) (Oral)   Resp 18   Ht 4\' 11"  (1.499 m)   Wt 54 kg (119 lb)   SpO2 100%   BMI 24.04 kg/m  Results for orders placed or performed during the hospital encounter of 09/14/16 (from the past 48 hour(s))  Comprehensive metabolic panel   Collection Time: 09/14/16  2:20 PM  Result Value Ref Range   Sodium 136 135 - 145 mmol/L   Potassium 4.4 3.5 - 5.1 mmol/L   Chloride 107  101 - 111 mmol/L   CO2 24 22 - 32 mmol/L   Glucose, Bld 193 (H) 65 - 99 mg/dL   BUN 6 6 - 20 mg/dL   Creatinine, Ser 6.570.57 0.44 - 1.00 mg/dL   Calcium 8.6 (L) 8.9 - 10.3 mg/dL   Total Protein 6.0 (L) 6.5 - 8.1 g/dL   Albumin 2.9 (L) 3.5 - 5.0 g/dL   AST 19 15 - 41 U/L   ALT 9 (L) 14 - 54 U/L   Alkaline Phosphatase 110 38 - 126 U/L   Total Bilirubin 0.4 0.3 - 1.2 mg/dL   GFR calc non Af Amer >60 >60 mL/min   GFR calc Af Amer >60 >60 mL/min   Anion gap 5 5 - 15  CBC   Collection Time: 09/14/16  2:20 PM  Result Value Ref Range   WBC 9.6 3.6 - 11.0 K/uL   RBC 4.13 3.80 - 5.20 MIL/uL   Hemoglobin 10.0 (L) 12.0 - 16.0 g/dL   HCT 84.631.0 (L) 96.235.0 - 95.247.0 %   MCV 75.0 (L) 80.0 - 100.0 fL   MCH 24.1 (L) 26.0 - 34.0 pg   MCHC 32.2 32.0 - 36.0 g/dL   RDW 84.122.6 (H) 32.411.5 - 40.114.5 %  Platelets 261 150 - 440 K/uL  Urinalysis, Routine w reflex microscopic   Collection Time: 09/14/16  2:54 PM  Result Value Ref Range   Color, Urine YELLOW (A) YELLOW   APPearance HAZY (A) CLEAR   Specific Gravity, Urine 1.010 1.005 - 1.030   pH 7.0 5.0 - 8.0   Glucose, UA >=500 (A) NEGATIVE mg/dL   Hgb urine dipstick NEGATIVE NEGATIVE   Bilirubin Urine NEGATIVE NEGATIVE   Ketones, ur NEGATIVE NEGATIVE mg/dL   Protein, ur NEGATIVE NEGATIVE mg/dL   Nitrite NEGATIVE NEGATIVE   Leukocytes, UA LARGE (A) NEGATIVE   RBC / HPF 0-5 0 - 5 RBC/hpf   WBC, UA 6-30 0 - 5 WBC/hpf   Bacteria, UA RARE (A) NONE SEEN   Squamous Epithelial / LPF 6-30 (A) NONE SEEN     Constitutional: NAD, AAOx3  HE/ENT: extraocular movements grossly intact, moist mucous membranes CV: RRR PULM: nl respiratory effort, CTABL     Abd: gravid, non-tender, non-distended, soft      Ext: Non-tender, Nonedmeatous   Psych: mood appropriate, speech normal Pelvic closed / 50 % / VTX  NST: Reactive   Baseline:  Variability: moderate Accelerations present x >2 Decelerations absent  uterine irritability    A/P: 21 y.o. [redacted]w[redacted]d here for antenatal  surveillance for non specific N/V . No episodes since . Specific gravity disproves significant dehydration   Labor: not present.   Fetal Wellbeing: Reassuring Cat 1 tracing.  Reactive NST    2 Liters of IVF   D/c home stable, precautions reviewed, follow-up as scheduled.  Zofran 4 mg po q6 hrs , phenergan 25 mg po q 6 hrs prn  ----- Jennell Corner  MD Attending Obstetrician and Gynecologist [facility], Department of OB/GYN [facility]

## 2016-09-14 NOTE — OB Triage Note (Signed)
Pt presents c/o ctx and vomiting since Sunday. States she has vomited at least 8-10 times in the last 24 hours. Pt states she tried taking nausea tablets but had no relief. Denies any bleeding or LOF. reports positive fetal movement. Will continue to monitor

## 2016-09-28 LAB — OB RESULTS CONSOLE GC/CHLAMYDIA
Chlamydia: NEGATIVE
Gonorrhea: NEGATIVE

## 2016-09-28 LAB — OB RESULTS CONSOLE GBS: GBS: NEGATIVE

## 2016-09-28 LAB — OB RESULTS CONSOLE RPR: RPR: NONREACTIVE

## 2016-10-06 NOTE — Progress Notes (Deleted)
[facility]  Telephone:(336) [phone] Fax:(336) [phone]  ID: Amanda DachKiana Welch OB: 01/13/1996  MR#: [id number]  VHQ#:[id number]#:[id number]  Patient Care Team: System, Pcp Not In as PCP - General  CHIEF COMPLAINT: Anemia affecting pregnancy in the third trimester  INTERVAL HISTORY: Patient is a 21 year old female who was found to have a declining hemoglobin data third trimester pregnancy. She currently feels well and is asymptomatic. She does not complain of weakness or fatigue. She has no neurologic complaints. She denies any recent fevers or illnesses. She is gaining weight appropriately. She denies any chest pain or shortness of breath. She has no nausea, vomiting, constipation, or diarrhea. She has no urinary complaints. Patient offers no specific complaints today.  REVIEW OF SYSTEMS:   Review of Systems  Constitutional: Negative.  Negative for fever, malaise/fatigue and weight loss.  Respiratory: Negative.  Negative for cough and shortness of breath.   Cardiovascular: Negative.  Negative for chest pain and leg swelling.  Gastrointestinal: Negative.  Negative for abdominal pain, blood in stool and melena.  Genitourinary: Negative.   Musculoskeletal: Negative.   Skin: Negative.  Negative for rash.  Neurological: Negative.  Negative for sensory change and weakness.  Psychiatric/Behavioral: Negative.  The patient is not nervous/anxious.     As per HPI. Otherwise, a complete review of systems is negative.  PAST MEDICAL HISTORY: Past Medical History:  Diagnosis Date  . Anemia   . Preeclampsia    with G1    PAST SURGICAL HISTORY: Past Surgical History:  Procedure Laterality Date  . CESAREAN SECTION  06/26/2013    FAMILY HISTORY: Family History  Problem Relation Age of Onset  . Diabetes Maternal Grandfather   . Breast cancer Neg Hx   . Ovarian cancer Neg Hx     ADVANCED DIRECTIVES (Y/N):  N  HEALTH MAINTENANCE: Social History  Substance Use Topics  . Smoking  status: Never Smoker  . Smokeless tobacco: Never Used  . Alcohol use No     Colonoscopy:  PAP:  Bone density:  Lipid panel:  No Known Allergies  Current Outpatient Prescriptions  Medication Sig Dispense Refill  . ferrous sulfate 325 (65 FE) MG tablet Take 1 tablet (325 mg total) by mouth 2 (two) times daily with a meal.  3  . Prenatal-DSS-FeCb-FeGl-FA (CITRANATAL BLOOM) 90-1 MG TABS Take 1 tablet by mouth daily. 30 tablet 11   No current facility-administered medications for this visit.     OBJECTIVE: There were no vitals filed for this visit.   There is no height or weight on file to calculate BMI.    ECOG FS:0 - Asymptomatic  General: Well-developed, well-nourished, no acute distress. Eyes: Pink conjunctiva, anicteric sclera. HEENT: Normocephalic, moist mucous membranes, clear oropharnyx. Lungs: Clear to auscultation bilaterally. Heart: Regular rate and rhythm. No rubs, murmurs, or gallops. Abdomen: Appears appropriate for gestational age. Musculoskeletal: No edema, cyanosis, or clubbing. Neuro: Alert, answering all questions appropriately. Cranial nerves grossly intact. Skin: No rashes or petechiae noted. Psych: Normal affect. Lymphatics: No cervical, calvicular, axillary or inguinal LAD.   LAB RESULTS:  Lab Results  Component Value Date   NA 136 09/14/2016   K 4.4 09/14/2016   CL 107 09/14/2016   CO2 24 09/14/2016   GLUCOSE 193 (H) 09/14/2016   BUN 6 09/14/2016   CREATININE 0.57 09/14/2016   CALCIUM 8.6 (L) 09/14/2016   PROT 6.0 (L) 09/14/2016   ALBUMIN 2.9 (L) 09/14/2016   AST 19 09/14/2016   ALT 9 (L) 09/14/2016   ALKPHOS 110  09/14/2016   BILITOT 0.4 09/14/2016   GFRNONAA >60 09/14/2016   GFRAA >60 09/14/2016    Lab Results  Component Value Date   WBC 9.6 09/14/2016   NEUTROABS 7.3 (H) 08/23/2016   HGB 10.0 (L) 09/14/2016   HCT 31.0 (L) 09/14/2016   MCV 75.0 (L) 09/14/2016   PLT 261 09/14/2016   Lab Results  Component Value Date   IRON 26 (L)  08/23/2016   TIBC 538 (H) 08/23/2016   IRONPCTSAT 5 (L) 08/23/2016   Lab Results  Component Value Date   FERRITIN 6 (L) 08/23/2016     STUDIES: No results found.  ASSESSMENT: Anemia affecting pregnancy in the third trimester.  PLAN:    1. Anemia affecting pregnancy in the third trimester: Patient's hemoglobin and iron stores are noted to be significantly decreased. She will return to clinic in 1 and 2 weeks to receive 510 mg IV Feraheme. She will then return to clinic in mid August for repeat laboratory work and further evaluation. If patient's hemoglobin does not improve with treatment, will consider for iron workup at that time. 2. Pregnancy: Patient reports she is having a schedule C-section at the end of August, but is unclear to date. Continue treatment and observation per OB/GYN.  Approximately 45 minutes was spent in discussion of which greater than 50% was consultation.  Patient expressed understanding and was in agreement with this plan. She also understands that She can call clinic at any time with any questions, concerns, or complaints.    Jeralyn Ruths, MD   10/06/2016 11:08 PM

## 2016-10-07 ENCOUNTER — Inpatient Hospital Stay: Payer: Medicaid Other

## 2016-10-07 ENCOUNTER — Inpatient Hospital Stay: Payer: Medicaid Other | Admitting: Oncology

## 2016-10-15 ENCOUNTER — Other Ambulatory Visit: Payer: Self-pay | Admitting: Obstetrics and Gynecology

## 2016-10-15 ENCOUNTER — Encounter
Admission: RE | Admit: 2016-10-15 | Discharge: 2016-10-15 | Disposition: A | Discharge status: Home / Self Care | Payer: Medicaid Other | Source: Ambulatory Visit | Attending: Obstetrics and Gynecology | Admitting: Obstetrics and Gynecology

## 2016-10-15 DIAGNOSIS — Z01812 Encounter for preprocedural laboratory examination: Secondary | ICD-10-CM | POA: Insufficient documentation

## 2016-10-15 LAB — CBC
HEMATOCRIT: 34.2 % — AB (ref 35.0–47.0)
HEMOGLOBIN: 11.7 g/dL — AB (ref 12.0–16.0)
MCH: 27.3 pg (ref 26.0–34.0)
MCHC: 34.2 g/dL (ref 32.0–36.0)
MCV: 79.9 fL — AB (ref 80.0–100.0)
Platelets: 256 10*3/uL (ref 150–440)
RBC: 4.28 MIL/uL (ref 3.80–5.20)
RDW: 28.3 % — ABNORMAL HIGH (ref 11.5–14.5)
WBC: 8.8 10*3/uL (ref 3.6–11.0)

## 2016-10-15 LAB — TYPE AND SCREEN
ABO/RH(D): A POS
Antibody Screen: NEGATIVE
EXTEND SAMPLE REASON: UNDETERMINED

## 2016-10-15 LAB — RAPID HIV SCREEN (HIV 1/2 AB+AG)
HIV 1/2 Antibodies: NONREACTIVE
HIV-1 P24 Antigen - HIV24: NONREACTIVE

## 2016-10-15 NOTE — Patient Instructions (Signed)
Your procedure is scheduled on:October 18, 2016 CHECK IN AT THE EMERGENCY DEPARTMENT AT 05:30AM   REMEMBER: Instructions that are not followed completely may result in serious medical risk up to and including death; or upon the discretion of your surgeon and anesthesiologist your surgery may need to be rescheduled.  Do not eat food or drink liquids after midnight. No gum chewing or hard candies  No Alcohol for 24 hours before or after surgery.  No Smoking for 24 hours prior to surgery.  Notify your doctor if there is any change in your medical condition (cold, fever, infection).  Do not wear jewelry, make-up, hairpins, clips or nail polish.  Do not wear lotions, powders, or perfumes.   Do not shave 48 hours prior to surgery. Men may shave face and neck.  Contacts and dentures may not be worn into surgery.  Do not bring valuables to the hospital. [facility] is not responsible for any belongings or valuables.   TAKE THESE MEDICATIONS THE MORNING OF SURGERY WITH A SIP OF WATER: NONE   Use CHG Soap or wipes as directed on instruction sheet.  Stop Anti-inflammatories such as Advil, Aleve, Ibuprofen, Motrin, Naproxen, Naprosyn, Goodie powder, or aspirin products. (May take Tylenol or Acetaminophen and Celebrex if needed.)  If you are being admitted to the hospital overnight, leave your suitcase in the car. After surgery it may be brought to your room.  If you are being discharged the day of surgery, you will not be allowed to drive home. You will need someone to drive you home and stay with you that night.   If you are taking public transportation, you will need to have a responsible adult to with you.

## 2016-10-16 LAB — RPR: RPR: NONREACTIVE

## 2016-10-18 ENCOUNTER — Inpatient Hospital Stay
Admission: RE | Admit: 2016-10-18 | Discharge: 2016-10-20 | DRG: 766 | Disposition: A | Discharge status: Home / Self Care | Payer: Medicaid Other | Source: Ambulatory Visit | Attending: Obstetrics and Gynecology | Admitting: Obstetrics and Gynecology

## 2016-10-18 ENCOUNTER — Inpatient Hospital Stay: Payer: Medicaid Other | Admitting: Anesthesiology

## 2016-10-18 ENCOUNTER — Encounter: Payer: Self-pay | Admitting: Obstetrics and Gynecology

## 2016-10-18 ENCOUNTER — Encounter
Admission: RE | Disposition: A | Discharge status: Home / Self Care | Payer: Self-pay | Source: Ambulatory Visit | Attending: Obstetrics and Gynecology

## 2016-10-18 DIAGNOSIS — D649 Anemia, unspecified: Secondary | ICD-10-CM | POA: Diagnosis present

## 2016-10-18 DIAGNOSIS — O9902 Anemia complicating childbirth: Secondary | ICD-10-CM | POA: Diagnosis present

## 2016-10-18 DIAGNOSIS — O0932 Supervision of pregnancy with insufficient antenatal care, second trimester: Secondary | ICD-10-CM

## 2016-10-18 DIAGNOSIS — Z3A39 39 weeks gestation of pregnancy: Secondary | ICD-10-CM | POA: Diagnosis not present

## 2016-10-18 DIAGNOSIS — Z3493 Encounter for supervision of normal pregnancy, unspecified, third trimester: Secondary | ICD-10-CM

## 2016-10-18 DIAGNOSIS — O34211 Maternal care for low transverse scar from previous cesarean delivery: Secondary | ICD-10-CM | POA: Diagnosis present

## 2016-10-18 DIAGNOSIS — O34219 Maternal care for unspecified type scar from previous cesarean delivery: Secondary | ICD-10-CM

## 2016-10-18 DIAGNOSIS — O99824 Streptococcus B carrier state complicating childbirth: Secondary | ICD-10-CM | POA: Diagnosis present

## 2016-10-18 LAB — CORD BLOOD GAS (ARTERIAL)
BICARBONATE: 21 mmol/L (ref 20.0–28.0)
PCO2 CORD BLOOD: 85 mmHg — AB (ref 42.0–56.0)
pH cord blood (arterial): 7 — CL (ref 7.210–7.380)

## 2016-10-18 LAB — CBC
HEMATOCRIT: 30.9 % — AB (ref 35.0–47.0)
HEMOGLOBIN: 10.8 g/dL — AB (ref 12.0–16.0)
MCH: 28 pg (ref 26.0–34.0)
MCHC: 34.9 g/dL (ref 32.0–36.0)
MCV: 80.1 fL (ref 80.0–100.0)
Platelets: 230 10*3/uL (ref 150–440)
RBC: 3.86 MIL/uL (ref 3.80–5.20)
RDW: 27.9 % — AB (ref 11.5–14.5)
WBC: 10 10*3/uL (ref 3.6–11.0)

## 2016-10-18 LAB — TYPE AND SCREEN
ABO/RH(D): A POS
ANTIBODY SCREEN: NEGATIVE

## 2016-10-18 SURGERY — Surgical Case
Anesthesia: Spinal | Wound class: Clean Contaminated

## 2016-10-18 MED ORDER — FENTANYL CITRATE (PF) 100 MCG/2ML IJ SOLN
INTRAMUSCULAR | Status: DC | PRN
  Administered 2016-10-18: 15 ug via INTRAVENOUS

## 2016-10-18 MED ORDER — OXYCODONE HCL 5 MG/5ML PO SOLN: 5.0000 mg | Freq: Once | ORAL | Status: DC | PRN

## 2016-10-18 MED ORDER — OXYCODONE-ACETAMINOPHEN 5-325 MG PO TABS: 1.0000 | ORAL_TABLET | ORAL | Status: DC | PRN

## 2016-10-18 MED ORDER — OXYCODONE HCL 5 MG PO TABS: 5.0000 mg | ORAL_TABLET | Freq: Once | ORAL | Status: DC | PRN

## 2016-10-18 MED ORDER — KETOROLAC TROMETHAMINE 30 MG/ML IJ SOLN
30.0000 mg | Freq: Four times a day (QID) | INTRAMUSCULAR | Status: AC | PRN
  Administered 2016-10-18: 30 mg via INTRAMUSCULAR

## 2016-10-18 MED ORDER — SOD CITRATE-CITRIC ACID 500-334 MG/5ML PO SOLN
ORAL | Status: AC
  Filled 2016-10-18: qty 15

## 2016-10-18 MED ORDER — SODIUM CHLORIDE 0.9 % IJ SOLN
INTRAMUSCULAR | Status: AC
  Filled 2016-10-18: qty 50

## 2016-10-18 MED ORDER — METHYLERGONOVINE MALEATE 0.2 MG/ML IJ SOLN
INTRAMUSCULAR | Status: AC
  Filled 2016-10-18: qty 1

## 2016-10-18 MED ORDER — BUPIVACAINE LIPOSOME 1.3 % IJ SUSP
INTRAMUSCULAR | Status: AC
  Filled 2016-10-18: qty 20

## 2016-10-18 MED ORDER — BUPIVACAINE HCL (PF) 0.5 % IJ SOLN
INTRAMUSCULAR | Status: AC
  Filled 2016-10-18: qty 10

## 2016-10-18 MED ORDER — COCONUT OIL OIL: 1.0000 "application " | TOPICAL_OIL | Status: DC | PRN

## 2016-10-18 MED ORDER — METHYLERGONOVINE MALEATE 0.2 MG/ML IJ SOLN
0.2000 mg | Freq: Once | INTRAMUSCULAR | Status: AC
  Administered 2016-10-18: 0.2 mg via INTRAMUSCULAR

## 2016-10-18 MED ORDER — MENTHOL 3 MG MT LOZG
1.0000 | LOZENGE | OROMUCOSAL | Status: DC | PRN
  Filled 2016-10-18: qty 9

## 2016-10-18 MED ORDER — ACETAMINOPHEN 500 MG PO TABS
1000.0000 mg | ORAL_TABLET | Freq: Four times a day (QID) | ORAL | Status: AC
  Administered 2016-10-18 – 2016-10-19 (×3): 1000 mg via ORAL
  Filled 2016-10-18 (×3): qty 2

## 2016-10-18 MED ORDER — ONDANSETRON HCL 4 MG/2ML IJ SOLN
4.0000 mg | Freq: Three times a day (TID) | INTRAMUSCULAR | Status: DC | PRN
  Administered 2016-10-18: 4 mg via INTRAVENOUS
  Filled 2016-10-18: qty 2

## 2016-10-18 MED ORDER — SODIUM CHLORIDE 0.9% FLUSH: 3.0000 mL | INTRAVENOUS | Status: DC | PRN

## 2016-10-18 MED ORDER — BUPIVACAINE HCL (PF) 0.5 % IJ SOLN
INTRAMUSCULAR | Status: AC
  Filled 2016-10-18: qty 30

## 2016-10-18 MED ORDER — CEFAZOLIN SODIUM-DEXTROSE 1-4 GM/50ML-% IV SOLN
INTRAVENOUS | Status: AC
  Filled 2016-10-18: qty 50

## 2016-10-18 MED ORDER — SODIUM CHLORIDE 0.9 % IV SOLN
Freq: Once | INTRAVENOUS | Status: AC
  Administered 2016-10-18: 23:00:00 via INTRAVENOUS

## 2016-10-18 MED ORDER — DIPHENHYDRAMINE HCL 50 MG/ML IJ SOLN: 12.5000 mg | INTRAMUSCULAR | Status: DC | PRN

## 2016-10-18 MED ORDER — FLEET ENEMA 7-19 GM/118ML RE ENEM: 1.0000 | ENEMA | Freq: Every day | RECTAL | Status: DC | PRN

## 2016-10-18 MED ORDER — OXYTOCIN 40 UNITS IN LACTATED RINGERS INFUSION - SIMPLE MED
INTRAVENOUS | Status: AC
  Filled 2016-10-18: qty 1000

## 2016-10-18 MED ORDER — SIMETHICONE 80 MG PO CHEW
80.0000 mg | CHEWABLE_TABLET | Freq: Three times a day (TID) | ORAL | Status: DC
  Administered 2016-10-18 – 2016-10-20 (×8): 80 mg via ORAL
  Filled 2016-10-18 (×7): qty 1

## 2016-10-18 MED ORDER — IBUPROFEN 600 MG PO TABS: 600.0000 mg | ORAL_TABLET | Freq: Four times a day (QID) | ORAL | Status: DC

## 2016-10-18 MED ORDER — LACTATED RINGERS IV SOLN
INTRAVENOUS | Status: DC
  Administered 2016-10-18: 07:00:00 via INTRAVENOUS

## 2016-10-18 MED ORDER — OXYCODONE-ACETAMINOPHEN 5-325 MG PO TABS: 2.0000 | ORAL_TABLET | ORAL | Status: DC | PRN

## 2016-10-18 MED ORDER — OXYTOCIN 40 UNITS IN LACTATED RINGERS INFUSION - SIMPLE MED
INTRAVENOUS | Status: DC | PRN
  Administered 2016-10-18: 1000 mL via INTRAVENOUS

## 2016-10-18 MED ORDER — SODIUM CHLORIDE 0.9 % IV SOLN
INTRAVENOUS | Status: DC | PRN
  Administered 2016-10-18: 60 mL

## 2016-10-18 MED ORDER — BUPIVACAINE LIPOSOME 1.3 % IJ SUSP: 20.0000 mL | Freq: Once | INTRAMUSCULAR | Status: DC

## 2016-10-18 MED ORDER — CEFAZOLIN SODIUM-DEXTROSE 2-4 GM/100ML-% IV SOLN
2.0000 g | INTRAVENOUS | Status: AC
  Administered 2016-10-18 (×2): 2 g via INTRAVENOUS
  Filled 2016-10-18: qty 100

## 2016-10-18 MED ORDER — NALBUPHINE HCL 10 MG/ML IJ SOLN: 5.0000 mg | INTRAMUSCULAR | Status: DC | PRN

## 2016-10-18 MED ORDER — FENTANYL CITRATE (PF) 100 MCG/2ML IJ SOLN
INTRAMUSCULAR | Status: AC
  Filled 2016-10-18: qty 2

## 2016-10-18 MED ORDER — NALBUPHINE HCL 10 MG/ML IJ SOLN: 5.0000 mg | Freq: Once | INTRAMUSCULAR | Status: DC | PRN

## 2016-10-18 MED ORDER — BISACODYL 10 MG RE SUPP: 10.0000 mg | Freq: Every day | RECTAL | Status: DC | PRN

## 2016-10-18 MED ORDER — MORPHINE SULFATE (PF) 0.5 MG/ML IJ SOLN
INTRAMUSCULAR | Status: DC | PRN
  Administered 2016-10-18: .1 mg via EPIDURAL

## 2016-10-18 MED ORDER — MORPHINE SULFATE (PF) 0.5 MG/ML IJ SOLN
INTRAMUSCULAR | Status: AC
  Filled 2016-10-18: qty 10

## 2016-10-18 MED ORDER — MEASLES, MUMPS & RUBELLA VAC ~~LOC~~ INJ
0.5000 mL | INJECTION | Freq: Once | SUBCUTANEOUS | Status: DC
  Filled 2016-10-18: qty 0.5

## 2016-10-18 MED ORDER — BUPIVACAINE IN DEXTROSE 0.75-8.25 % IT SOLN
INTRATHECAL | Status: DC | PRN
  Administered 2016-10-18: 1.5 mL via INTRATHECAL

## 2016-10-18 MED ORDER — SIMETHICONE 80 MG PO CHEW
80.0000 mg | CHEWABLE_TABLET | ORAL | Status: DC | PRN
  Administered 2016-10-20: 80 mg via ORAL
  Filled 2016-10-18: qty 1

## 2016-10-18 MED ORDER — MEPERIDINE HCL 25 MG/ML IJ SOLN: 6.2500 mg | INTRAMUSCULAR | Status: DC | PRN

## 2016-10-18 MED ORDER — OXYTOCIN 10 UNIT/ML IJ SOLN
INTRAMUSCULAR | Status: AC
  Filled 2016-10-18: qty 4

## 2016-10-18 MED ORDER — SOD CITRATE-CITRIC ACID 500-334 MG/5ML PO SOLN
30.0000 mL | Freq: Once | ORAL | Status: AC
  Administered 2016-10-18: 30 mL via ORAL

## 2016-10-18 MED ORDER — KETOROLAC TROMETHAMINE 30 MG/ML IJ SOLN
30.0000 mg | Freq: Four times a day (QID) | INTRAMUSCULAR | Status: AC | PRN
  Administered 2016-10-18 – 2016-10-19 (×3): 30 mg via INTRAVENOUS
  Filled 2016-10-18 (×4): qty 1

## 2016-10-18 MED ORDER — SENNOSIDES-DOCUSATE SODIUM 8.6-50 MG PO TABS
2.0000 | ORAL_TABLET | ORAL | Status: DC
  Administered 2016-10-19 – 2016-10-20 (×2): 2 via ORAL
  Filled 2016-10-18 (×2): qty 2

## 2016-10-18 MED ORDER — DIBUCAINE 1 % RE OINT: 1.0000 "application " | TOPICAL_OINTMENT | RECTAL | Status: DC | PRN

## 2016-10-18 MED ORDER — DIPHENHYDRAMINE HCL 25 MG PO CAPS: 25.0000 mg | ORAL_CAPSULE | ORAL | Status: DC | PRN

## 2016-10-18 MED ORDER — OXYTOCIN 40 UNITS IN LACTATED RINGERS INFUSION - SIMPLE MED
2.5000 [IU]/h | INTRAVENOUS | Status: AC
  Administered 2016-10-18: 2.5 [IU]/h via INTRAVENOUS

## 2016-10-18 MED ORDER — IBUPROFEN 600 MG PO TABS
600.0000 mg | ORAL_TABLET | Freq: Four times a day (QID) | ORAL | Status: DC
  Administered 2016-10-19 – 2016-10-20 (×5): 600 mg via ORAL
  Filled 2016-10-18 (×6): qty 1

## 2016-10-18 MED ORDER — TETANUS-DIPHTH-ACELL PERTUSSIS 5-2.5-18.5 LF-MCG/0.5 IM SUSP
0.5000 mL | Freq: Once | INTRAMUSCULAR | Status: AC
  Filled 2016-10-18: qty 0.5

## 2016-10-18 MED ORDER — ACETAMINOPHEN 325 MG PO TABS: 650.0000 mg | ORAL_TABLET | ORAL | Status: DC | PRN

## 2016-10-18 MED ORDER — FENTANYL CITRATE (PF) 100 MCG/2ML IJ SOLN: 25.0000 ug | INTRAMUSCULAR | Status: DC | PRN

## 2016-10-18 MED ORDER — SIMETHICONE 80 MG PO CHEW
80.0000 mg | CHEWABLE_TABLET | ORAL | Status: DC
  Filled 2016-10-18: qty 1

## 2016-10-18 MED ORDER — PHENYLEPHRINE HCL 10 MG/ML IJ SOLN
INTRAMUSCULAR | Status: DC | PRN
  Administered 2016-10-18 (×2): 100 ug via INTRAVENOUS
  Administered 2016-10-18: 50 ug via INTRAVENOUS
  Administered 2016-10-18: 100 ug via INTRAVENOUS
  Administered 2016-10-18: 50 ug via INTRAVENOUS
  Administered 2016-10-18: 100 ug via INTRAVENOUS
  Administered 2016-10-18: 200 ug via INTRAVENOUS

## 2016-10-18 MED ORDER — DIPHENHYDRAMINE HCL 25 MG PO CAPS: 25.0000 mg | ORAL_CAPSULE | Freq: Four times a day (QID) | ORAL | Status: DC | PRN

## 2016-10-18 MED ORDER — LACTATED RINGERS IV BOLUS (SEPSIS)
1000.0000 mL | Freq: Once | INTRAVENOUS | Status: AC
  Administered 2016-10-18: 1000 mL via INTRAVENOUS

## 2016-10-18 MED ORDER — PRENATAL MULTIVITAMIN CH
1.0000 | ORAL_TABLET | Freq: Every day | ORAL | Status: DC
  Administered 2016-10-19 – 2016-10-20 (×2): 1 via ORAL
  Filled 2016-10-18 (×2): qty 1

## 2016-10-18 MED ORDER — NALOXONE HCL 0.4 MG/ML IJ SOLN: 0.4000 mg | INTRAMUSCULAR | Status: DC | PRN

## 2016-10-18 MED ORDER — WITCH HAZEL-GLYCERIN EX PADS: 1.0000 "application " | MEDICATED_PAD | CUTANEOUS | Status: DC | PRN

## 2016-10-18 MED ORDER — OXYCODONE HCL 5 MG PO TABS: 5.0000 mg | ORAL_TABLET | Freq: Four times a day (QID) | ORAL | Status: DC | PRN

## 2016-10-18 MED ORDER — BUPIVACAINE HCL (PF) 0.25 % IJ SOLN
INTRAMUSCULAR | Status: DC | PRN
  Administered 2016-10-18: 30 mL

## 2016-10-18 MED ORDER — LACTATED RINGERS IV SOLN
INTRAVENOUS | Status: DC
  Administered 2016-10-19: 05:00:00 via INTRAVENOUS

## 2016-10-18 SURGICAL SUPPLY — 24 items
BARRIER ADHS 3X4 INTERCEED (GAUZE/BANDAGES/DRESSINGS) ×3 IMPLANT
CANISTER SUCT 3000ML PPV (MISCELLANEOUS) ×3 IMPLANT
CHLORAPREP W/TINT 26ML (MISCELLANEOUS) ×3 IMPLANT
DRSG TELFA 3X8 NADH (GAUZE/BANDAGES/DRESSINGS) ×3 IMPLANT
ELECT REM PT RETURN 9FT ADLT (ELECTROSURGICAL) ×3
ELECTRODE REM PT RTRN 9FT ADLT (ELECTROSURGICAL) ×1 IMPLANT
GAUZE SPONGE 4X4 12PLY STRL (GAUZE/BANDAGES/DRESSINGS) ×3 IMPLANT
GOWN STRL REUS W/ TWL LRG LVL3 (GOWN DISPOSABLE) ×3 IMPLANT
GOWN STRL REUS W/TWL LRG LVL3 (GOWN DISPOSABLE) ×6
NS IRRIG 1000ML POUR BTL (IV SOLUTION) ×3 IMPLANT
PAD OB MATERNITY 4.3X12.25 (PERSONAL CARE ITEMS) ×3 IMPLANT
PAD PREP 24X41 OB/GYN DISP (PERSONAL CARE ITEMS) ×3 IMPLANT
SUCT VACUUM KIWI BELL (SUCTIONS) ×3 IMPLANT
SUT MNCRL 4-0 (SUTURE)
SUT MNCRL 4-0 27XMFL (SUTURE)
SUT PDS AB 1 TP1 96 (SUTURE) ×3 IMPLANT
SUT PLAIN 2 0 XLH (SUTURE) ×3 IMPLANT
SUT PLAIN GUT 2-0 30 C14 SG823 (SUTURE) ×3
SUT VIC AB 0 CT1 36 (SUTURE) ×15 IMPLANT
SUT VIC AB 3-0 SH 27 (SUTURE) ×2
SUT VIC AB 3-0 SH 27X BRD (SUTURE) ×1 IMPLANT
SUTURE MNCRL 4-0 27XMF (SUTURE) IMPLANT
SUTURE PLN GUT2-0 30 C14 SG823 (SUTURE) ×1 IMPLANT
SYR 30ML LL (SYRINGE) ×6 IMPLANT

## 2016-10-18 NOTE — Transfer of Care (Signed)
Immediate Anesthesia Transfer of Care Note  Patient: Amanda Welch  Procedure(s) Performed: Procedure(s): CESAREAN SECTION - REPEAT (N/A)  Patient Location: PACU and Mother/Baby  Anesthesia Type:Spinal  Level of Consciousness: awake, alert  and oriented  Airway & Oxygen Therapy: Patient Spontanous Breathing  Post-op Assessment: Report given to RN and Post -op Vital signs reviewed and stable  Post vital signs: Reviewed and stable  Last Vitals:  Vitals:   10/18/16 0545 10/18/16 0935  BP: 117/70 108/67  Pulse: 91 86  Resp: 16 18  Temp: 37.1 C   SpO2: 99% 99%    Last Pain:  Vitals:   10/18/16 0710  TempSrc:   PainSc: 0-No pain         Complications: No apparent anesthesia complications

## 2016-10-18 NOTE — H&P (Addendum)
Obstetric Preoperative History and Physical  Amanda Welch is a 21 y.o. G2P1001 with IUP at [redacted]w[redacted]d presenting for scheduled cesarean section.  No acute concerns.   Prenatal Course Source of Care: [facility]  with onset of care at 22 weeks with [facility], moved here in March  Pregnancy complications or risks: Patient Active Problem List   Diagnosis Date Noted  . Labor and delivery, indication for care 09/14/2016  . Anemia affecting pregnancy in third trimester 08/07/2016  . Abdominal pain affecting pregnancy, antepartum 07/28/2016  . GBS (group b Streptococcus) UTI complicating pregnancy, second trimester 07/26/2016  . Indication for care in labor and delivery, antepartum 07/26/2016  . Nausea and vomiting during pregnancy 07/23/2016  . Lower abdominal pain 07/23/2016  . Pregnancy with history of cesarean section, antepartum 07/23/2016  . Insufficient prenatal care in second trimester 07/23/2016  . Hx of preeclampsia, prior pregnancy, currently pregnant 07/23/2016     Palpatations/SOB in third trimester: DDx includes anemia, anxiety, cardiovascular- ativan trial, IV iron and referral to cards  Insufficient prenatal care  History of preeclampsia  History of previous c-section - repeat LTCS scheduled for 10/18/16 BEB  Non-Immune Varicella (will need postpartum)   Anemia- 8.5 on 08/04/16   She plans to breastfeed She is undecided for postpartum contraception.   Prenatal labs and studies: ABO, Rh: --/--/PENDING (08/27 3491) Antibody: PENDING (08/27 0626) Rubella: Immune (01/31 0000) RPR: Non Reactive (08/24 0859)  HBsAg: Negative (01/31 0000)  HIV: Non-reactive (01/31 0000)  GBS: Positive in urine: would need ppx if labors 1 hr Glucola  73   Past Medical History:  Diagnosis Date  . Anemia   . Preeclampsia    with G1    Past Surgical History:  Procedure Laterality Date  . CESAREAN SECTION  06/26/2013    OB History  Gravida Para Term Preterm AB Living  2 1 1  0 0 1  SAB TAB  Ectopic Multiple Live Births  0 0 0 0 1    # Outcome Date GA Lbr Len/2nd Weight Sex Delivery Anes PTL Lv  2 Current           1 Term 06/26/13 [redacted]w[redacted]d  5 lb 8 oz (2.495 kg) F CS-LTranv Spinal Y LIV     Complications: Failure to Progress in First Stage,Preeclampsia      Social History   Social History  . Marital status: Married    Spouse name: N/A  . Number of children: N/A  . Years of education: N/A   Social History Main Topics  . Smoking status: Never Smoker  . Smokeless tobacco: Never Used  . Alcohol use No  . Drug use: No  . Sexual activity: Yes    Partners: Male   Other Topics Concern  . None   Social History Narrative  . None    Family History  Problem Relation Age of Onset  . Diabetes Maternal Grandfather   . Breast cancer Neg Hx   . Ovarian cancer Neg Hx     Prescriptions Prior to Admission  Medication Sig Dispense Refill Last Dose  . ferrous sulfate 325 (65 FE) MG tablet Take 1 tablet (325 mg total) by mouth 2 (two) times daily with a meal.  3 10/17/2016 at Unknown time  . Prenatal-DSS-FeCb-FeGl-FA (CITRANATAL BLOOM) 90-1 MG TABS Take 1 tablet by mouth daily. 30 tablet 11 10/17/2016 at Unknown time    No Known Allergies  Review of Systems: Negative except for what is mentioned in HPI.  Physical Exam: BP 117/70 (BP  Location: Left Arm)   Pulse 91   Temp 98.7 F (37.1 C) (Oral)   Resp 16   Ht 4\' 11"  (1.499 m)   Wt 125 lb (56.7 kg)   LMP  (LMP Unknown)   SpO2 99%   BMI 25.25 kg/m  FHR by Doppler: 140 bpm CONSTITUTIONAL: Well-developed, well-nourished female in no acute distress.  HENT:  Normocephalic, atraumatic, External right and left ear normal. Oropharynx is clear and moist EYES: Conjunctivae and EOM are normal. Pupils are equal, round, and reactive to light. No scleral icterus.  NECK: Normal range of motion, supple, no masses SKIN: Skin is warm and dry. No rash noted. Not diaphoretic. No erythema. No pallor. NEUROLGIC: Alert and oriented to  person, place, and time. Normal reflexes, muscle tone coordination. No cranial nerve deficit noted. PSYCHIATRIC: Normal mood and affect. Normal behavior. Normal judgment and thought content. CARDIOVASCULAR: Normal heart rate noted, regular rhythm RESPIRATORY: Effort and breath sounds normal, no problems with respiration noted ABDOMEN: Soft, nontender, nondistended, gravid. Well-healed Pfannenstiel incision. PELVIC: Deferred MUSCULOSKELETAL: Normal range of motion. No edema and no tenderness. 2+ distal pulses.   Pertinent Labs/Studies:   Results for orders placed or performed during the hospital encounter of 10/18/16 (from the past 72 hour(s))  CBC     Status: Abnormal   Collection Time: 10/18/16  6:26 AM  Result Value Ref Range   WBC 10.0 3.6 - 11.0 K/uL   RBC 3.86 3.80 - 5.20 MIL/uL   Hemoglobin 10.8 (L) 12.0 - 16.0 g/dL   HCT 16.1 (L) 09.6 - 04.5 %   MCV 80.1 80.0 - 100.0 fL   MCH 28.0 26.0 - 34.0 pg   MCHC 34.9 32.0 - 36.0 g/dL   RDW 40.9 (H) 81.1 - 91.4 %   Platelets 230 150 - 440 K/uL  Type and screen [facility]     Status: None (Preliminary result)   Collection Time: 10/18/16  6:26 AM  Result Value Ref Range   ABO/RH(D) PENDING    Antibody Screen PENDING    Sample Expiration 10/21/2016     Assessment and Plan :Amanda Welch is a 21 y.o. G2P1001 at [redacted]w[redacted]d being admitted for scheduled cesarean section. The risks of cesarean section discussed with the patient included but were not limited to: bleeding which may require transfusion or reoperation; infection which may require antibiotics; injury to bowel, bladder, ureters or other surrounding organs; injury to the fetus; need for additional procedures including hysterectomy in the event of a life-threatening hemorrhage; placental abnormalities wth subsequent pregnancies, incisional problems, thromboembolic phenomenon and other postoperative/anesthesia complications. The patient concurred with the proposed plan,  giving informed written consent for the procedure. Patient has been NPO since last night she will remain NPO for procedure. Anesthesia and OR aware. Preoperative prophylactic antibiotics and SCDs ordered on call to the OR. To OR when ready.    Christeen Douglas, MD, MPH, Evern Core

## 2016-10-18 NOTE — Anesthesia Procedure Notes (Signed)
Spinal  Patient location during procedure: OR Start time: 10/18/2016 8:08 AM End time: 10/18/2016 8:12 AM Staffing Anesthesiologist: Andria Frames Resident/CRNA: Jonna Clark Performed: resident/CRNA  Preanesthetic Checklist Completed: patient identified, site marked, surgical consent, pre-op evaluation, timeout performed, IV checked, risks and benefits discussed and monitors and equipment checked Spinal Block Patient position: sitting Prep: Betadine Patient monitoring: heart rate, continuous pulse ox, blood pressure and cardiac monitor Approach: midline Location: L4-5 Injection technique: single-shot Needle Needle type: Whitacre and Introducer  Needle gauge: 24 G Needle length: 9 cm Assessment Sensory level: T3 Additional Notes Negative paresthesia. Negative blood return. Positive free-flowing CSF. Expiration date of kit checked and confirmed. Patient tolerated procedure well, without complications.

## 2016-10-18 NOTE — Op Note (Signed)
  Cesarean Section Procedure Note  Date of procedure: 10/18/2016   Pre-operative Diagnosis: Intrauterine pregnancy at [redacted]w[redacted]d; prior LTCS  Post-operative Diagnosis: same, delivered.  Procedure: Repeat Low Transverse Cesarean Section through Pfannenstiel incision  Surgeon: Christeen Douglas, MD  Assistant(s):  CST  Anesthesia: Spinal anesthesia and liposomal bupivacaine   Anesthesiologist: Piscitello, Cleda Mccreedy, MD Anesthesiologist: Piscitello, Cleda Mccreedy, MD CRNA: Malva Cogan, CRNA; Omer Jack, CRNA  Estimated Blood Loss:  500         Drains: none         Total IV Fluids:  Urine Output:         Specimens: Cord gas - arterial         Complications:  None; patient tolerated the procedure well.         Disposition: PACU - hemodynamically stable.         Condition: stable  Findings:  A female infant "Kashara" in cephalic presentation. Amniotic fluid - Clear  Birth weight 3060 g.  Apgars of 7 and 9 at one and five minutes respectively.  Intact placenta with a three-vessel cord.  Grossly normal uterus, tubes and ovaries bilaterally. Dense intraabdominal adhesions were noted.  Indications: previous uterine incision -x1  Procedure Details  The patient was taken to Operating Room, identified as the correct patient and the procedure verified as C-Section Delivery. A formal Time Out was held with all team members present and in agreement.  After induction of anesthesia, the patient was draped and prepped in the usual sterile manner. Fetal heart tones of 102 were noted. A Pfannenstiel skin incision was made and carried down through the subcutaneous tissue to the fascia. Fascial incision was made and extended transversely with the Mayo scissors. The fascia was separated from the underlying rectus tissue superiorly and inferiorly, with significant adhesions. The peritoneum was identified and entered bluntly. Peritoneal incision was extended longitudinally. The  utero-vesical peritoneal reflection was incised transversely and a bladder flap was created digitally.   A low transverse hysterotomy was made. The fetus was delivered atraumatically. The umbilical cord was clamped x2 and cut and the infant was handed to the awaiting pediatricians. The placenta was removed intact and appeared normal, intact, and with a 3-vessel cord. A section of the cord was doubly clamped and set aside for arterial cord gas.  The uterus was exteriorized and cleared of all clot and debris. The hysterotomy was closed with running sutures of 0-Vicryl. A second imbricating layer was placed with the same suture. Excellent hemostasis was observed. The peritoneal cavity was cleared of all clots and debris. The uterus was returned to the abdomen.   The pelvis was irrigated and again, excellent hemostasis was noted. The fascia was then reapproximated with running sutures of 0 Vicryl.  The subcutaneous tissue was reapproximated with interrupted sutures of 0 Vicry. The skin was reapproximated with a 4-0 Monocryl subcuticular stitch. 61ml (in 30 of 0.5% bupivicaine and 38ml of NSS) of liposomal bupivicaine placed in the fascial and skin lines.  Instrument, sponge, and needle counts were correct prior to the abdominal closure and at the conclusion of the case.   The patient tolerated the procedure well and was transferred to the recovery room in stable condition.   Christeen Douglas, MD 10/18/2016

## 2016-10-18 NOTE — Anesthesia Post-op Follow-up Note (Signed)
Anesthesia QCDR form completed.        

## 2016-10-18 NOTE — Progress Notes (Signed)
Patient ID: Amanda Welch, female   DOB: 1995-05-17, 21 y.o.   MRN: [id number] Paged by RN for 25 ml urine output and then another 25 ml in a 5 hours period. Advised to give 500 ml of .9NS bolus as pt is behind on I&O's.

## 2016-10-18 NOTE — Anesthesia Preprocedure Evaluation (Signed)
Anesthesia Evaluation  Patient identified by MRN, date of birth, ID band Patient awake    Reviewed: Allergy & Precautions, H&P , NPO status , Patient's Chart, lab work & pertinent test results  History of Anesthesia Complications Negative for: history of anesthetic complications  Airway Mallampati: II  TM Distance: >3 FB Neck ROM: full    Dental  (+) Chipped   Pulmonary neg pulmonary ROS, neg shortness of breath,           Cardiovascular Exercise Tolerance: Good hypertension, (-) angina(-) Past MI and (-) DOE      Neuro/Psych    GI/Hepatic negative GI ROS, neg GERD  ,  Endo/Other    Renal/GU   negative genitourinary   Musculoskeletal   Abdominal   Peds  Hematology negative hematology ROS (+)   Anesthesia Other Findings Past Medical History: No date: Anemia No date: Preeclampsia     Comment:  with G1  Past Surgical History: 06/26/2013: CESAREAN SECTION  BMI    Body Mass Index:  25.25 kg/m      Reproductive/Obstetrics (+) Pregnancy                             Anesthesia Physical Anesthesia Plan  ASA: II  Anesthesia Plan: Spinal   Post-op Pain Management:    Induction:   PONV Risk Score and Plan:   Airway Management Planned: Natural Airway and Nasal Cannula  Additional Equipment:   Intra-op Plan:   Post-operative Plan:   Informed Consent: I have reviewed the patients History and Physical, chart, labs and discussed the procedure including the risks, benefits and alternatives for the proposed anesthesia with the patient or authorized representative who has indicated his/her understanding and acceptance.   Dental Advisory Given  Plan Discussed with: Anesthesiologist, CRNA and Surgeon  Anesthesia Plan Comments: (Patient reports no bleeding problems and no anticoagulant use.  Plan for spinal with backup GA  Patient consented for risks of anesthesia including but  not limited to:  - adverse reactions to medications - risk of bleeding, infection, nerve damage and headache - risk of failed spinal - damage to teeth, lips or other oral mucosa - sore throat or hoarseness - Damage to heart, brain, lungs or loss of life  Patient voiced understanding.)        Anesthesia Quick Evaluation

## 2016-10-18 NOTE — ED Triage Notes (Signed)
Pt has arrived for scheduled c-section; denies pain; denies vaginal bleeding; denies complications this pregnancy; positive fetal movement; due date 10/24/16; pt at [facility] clinic; G2P1A0; pt ambulatory to LDR2 at her request with ED tech Mayra

## 2016-10-18 NOTE — Discharge Summary (Signed)
Obstetrical Discharge Summary  Patient Name: Amanda Welch DOB: [birth year] MRN: [id number]  Date of Admission: 10/18/2016 Date of Discharge: 10/18/2016  Primary OB: [facility]   Gestational Age at Delivery: [redacted]w[redacted]d   Antepartum complications:   Palpatations/SOB in third trimester: DDx includes anemia, anxiety, cardiovascular- ativan trial, IV iron and referral to cards  Insufficient prenatal care  History of preeclampsia  History of previous c-section - repeat LTCS scheduled for 10/18/16 BEB  Non-Immune Varicella (will need postpartum)   Anemia- 8.5 on 08/04/16, 10.5 on admission  Admitting Diagnosis: Scheduled C/S  Secondary Diagnosis: Patient Active Problem List   Diagnosis Date Noted  . Labor and delivery, indication for care 09/14/2016  . Anemia affecting pregnancy in third trimester 08/07/2016  . Abdominal pain affecting pregnancy, antepartum 07/28/2016  . GBS (group b Streptococcus) UTI complicating pregnancy, second trimester 07/26/2016  . Indication for care in labor and delivery, antepartum 07/26/2016  . Nausea and vomiting during pregnancy 07/23/2016  . Lower abdominal pain 07/23/2016  . Pregnancy with history of cesarean section, antepartum 07/23/2016  . Insufficient prenatal care in second trimester 07/23/2016  . Hx of preeclampsia, prior pregnancy, currently pregnant 07/23/2016    Complications: None Intrapartum complications/course:  rLTCS at 39+1wks  Date of Delivery: 10/18/16 Delivered By: Christeen Douglas  Delivery Type: repeat cesarean section, low transverse incision Anesthesia: spinal Placenta: extracted Episiotomy: none Newborn Data: This patient has no babies on file.   Discharge Physical Exam: POD#2 BP 117/70 (BP Location: Left Arm)   Pulse 91   Temp 98.7 F (37.1 C) (Oral)   Resp 16   Ht 4\' 11"  (1.499 m)   Wt 125 lb (56.7 kg)   LMP  (LMP Unknown)   SpO2 99%   BMI 25.25 kg/m   General: NAD CV: RRR Pulm: CTABL, nl  effort ABD: s/nd/nt, fundus firm and below the umbilicus Lochia: moderate Incision: c/d/i , Steristrips intact DVT Evaluation: LE non-ttp, no evidence of DVT on exam.  Hemoglobin  Date Value Ref Range Status  10/18/2016 10.8 (L) 12.0 - 16.0 g/dL Final   HCT  Date Value Ref Range Status  10/18/2016 30.9 (L) 35.0 - 47.0 % Final    Post partum course: Stable  Postpartum Procedures: None Disposition: stable, discharge to home. Baby Feeding: breastmilk Baby Disposition: home with mom  Rh Immune globulin given: N/A Rubella vaccine given: Immune Tdap vaccine given in AP or PP setting: Given AP Flu vaccine given in AP or PP setting: N/A  Contraception:To be decided   Prenatal Labs: See notes    Plan:  Haleigh Groshong was discharged to home in good condition. Follow-up appointment at [facility] in 2 weeks   Discharge Medications: PNV and Percocet     Signed: Sharee Pimple, RN, MSN, CNM, FNP

## 2016-10-18 NOTE — Lactation Note (Signed)
This note was copied from a baby's chart. Lactation Consultation Note Assisted mom with each breast feeding since birth.  Amanda Welch refused the breast right after delivery.  She fed well for second breast feed opening mouth wide and latching with minimal difficulty.  Mom did not breastfeed her first daughter who is now 23 years old.  For this breast feed, we had difficulty getting her to open wide enough for deep latch with tongue going to roof of mouth.  We can easily hand express lots of colostrum.  After finger suck training, we were able to get her lips flanged, tongue down and achieve deep enough latch for her to feel the breast in the roof of her mouth and begin good rhythmic sucking with occasional swallows.  Reviewed supply and demand and need to breast feed every 2 to 3 hours or on demand to bring in mature milk and ensure a plentiful milk supply.  Discussed normal course of lactation and routine newborn feeding patterns.  Lactation name and number written on white board and encouraged to call for questions, concerns or assistance.   Patient Name: Amanda Welch [id number] Date: 10/18/2016     Maternal Data    Feeding Feeding Type: Breast Fed Length of feed: 5 min (per I&O sheet)  LATCH Score                   Interventions    Lactation Tools Discussed/Used     Consult Status      Amanda Welch 10/18/2016, 9:48 PM

## 2016-10-19 ENCOUNTER — Encounter: Payer: Self-pay | Admitting: Obstetrics and Gynecology

## 2016-10-19 LAB — CBC
HCT: 22.6 % — ABNORMAL LOW (ref 35.0–47.0)
Hemoglobin: 7.7 g/dL — ABNORMAL LOW (ref 12.0–16.0)
MCH: 27.6 pg (ref 26.0–34.0)
MCHC: 34.1 g/dL (ref 32.0–36.0)
MCV: 81 fL (ref 80.0–100.0)
PLATELETS: 191 10*3/uL (ref 150–440)
RBC: 2.79 MIL/uL — ABNORMAL LOW (ref 3.80–5.20)
RDW: 27.4 % — ABNORMAL HIGH (ref 11.5–14.5)
WBC: 13.5 10*3/uL — AB (ref 3.6–11.0)

## 2016-10-19 NOTE — Clinical Social Work Maternal (Signed)
  CLINICAL SOCIAL WORK MATERNAL/CHILD NOTE  Patient Details  Name: Amanda Welch MRN: [id number] Date of Birth: 14-May-1995  Date:  10/19/2016  Clinical Social Worker Initiating Note:   Mel Almond Oralia Criger, [place] [phone] ) Date/ Time Initiated:  10/19/16/1427     Child's Name:   [name] )   Legal Guardian:  Mother   Need for Interpreter:  None   Date of Referral:  10/19/16     Reason for Referral:  Other (Comment), Late or No Prenatal Care  (Moved to the area in March. No prenatal care)   Referral Source:  RN   Address:   ([address] [address])  Phone number:   [phone])   Household Members:  Self, Minor Children, Spouse   Natural Supports (not living in the home):  Extended Family, Friends   Chiropodist: None   Employment: Part-time   Type of Work:     Education:  Database administrator Resources:  [employer]   Other Resources:  [facility]   Cultural/Religious Considerations Which May Impact Care:  N/A  Strengths:  Ability to meet basic needs , Compliance with medical plan , Home prepared for child    Risk Factors/Current Problems:  None   Cognitive State:  Goal Oriented , Insightful , Linear Thinking , Alert    Mood/Affect:    Happy/ Calm  CSW Assessment: Clinical Education officer, museum (CSW) received consult for "no prenatal care and patient recently moved to the area." CSW met with patient and her husband Roxan Hockey, her father and her first born daughter were at bedside. Patient was alert and oriented X4 and was holding the infant in bed. CSW introduced self and explained role of CSW department. Patient was calm and answered questions appropriately. Per patient she and her family just moved to [place] from [place]. Patient reported that this is her second child and she now has 2 girls. Patient reported that she did receive prenatal care in [place] and will transition her care to [facility]. Patient reported that she has all the  supplies needed for infant and reported no other needs or concerns. CSW provided patient with a list of [facility] resources. Per RN there are no concerns at this time. Please reconsult if future social work needs arise. CSW signing off.   CSW Plan/Description:  No Further Intervention Required/No Barriers to Discharge    Telly Jawad, Veronia Beets, LCSW 10/19/2016, 2:30 PM

## 2016-10-19 NOTE — Anesthesia Postprocedure Evaluation (Signed)
Anesthesia Post Note  Patient: Amanda Welch  Procedure(s) Performed: Procedure(s) (LRB): CESAREAN SECTION - REPEAT (N/A)  Patient location during evaluation: Mother Baby Anesthesia Type: Spinal Level of consciousness: oriented and awake and alert Pain management: pain level controlled Vital Signs Assessment: post-procedure vital signs reviewed and stable Respiratory status: spontaneous breathing, respiratory function stable and patient connected to nasal cannula oxygen Cardiovascular status: blood pressure returned to baseline and stable Postop Assessment: no headache and no backache Anesthetic complications: no     Last Vitals:  Vitals:   10/19/16 0503 10/19/16 0749  BP: (!) 97/51 109/69  Pulse: 84 90  Resp: 18 18  Temp: 36.8 C 36.8 C  SpO2: 99% 100%    Last Pain:  Vitals:   10/19/16 0749  TempSrc: Oral  PainSc:                  [name]

## 2016-10-19 NOTE — Anesthesia Post-op Follow-up Note (Signed)
  Anesthesia Pain Follow-up Note  Patient: Amanda Welch  Day #: 1  Date of Follow-up: 10/19/2016 Time: 8:16 AM  Last Vitals:  Vitals:   10/19/16 0503 10/19/16 0749  BP: (!) 97/51 109/69  Pulse: 84 90  Resp: 18 18  Temp: 36.8 C 36.8 C  SpO2: 99% 100%    Level of Consciousness: alert  Pain: mild   Side Effects:None  Catheter Site Exam:dry     Plan: D/C from anesthesia care at surgeon's request  [name]

## 2016-10-19 NOTE — Progress Notes (Signed)
Subjective: Postpartum Day 1: Cesarean Delivery Patient reports incisional pain.  Tolerating PO. Low urine output to 25cc/hr of dark amber urine overnight. S/P 500cc bolus of fluid with adequate response, urine output now 100cc/hr. H/h dropped. Pt still with foley, not yet OOB. Breastfeeding going well.  Objective: Vital signs in last 24 hours: Temp:  [98.1 F (36.7 C)-99.4 F (37.4 C)] 98.2 F (36.8 C) (08/28 0749) Pulse Rate:  [84-111] 90 (08/28 0749) Resp:  [14-27] 18 (08/28 0749) BP: (93-129)/(43-91) 109/69 (08/28 0749) SpO2:  [97 %-100 %] 100 % (08/28 0749)  Physical Exam:  General: alert, cooperative, appears stated age and fatigued Lochia: appropriate CV: Mild systolic ejection murmur Pulm: CTAB Uterine Fundus: firm Incision: dressing c/d/i DVT Evaluation: No evidence of DVT seen on physical exam.   Recent Labs  10/18/16 0626 10/19/16 0452  HGB 10.8* 7.7*  HCT 30.9* 22.6*    Assessment/Plan: Status post Cesarean section. Postoperative course complicated by acute blood loss anemia. OOB today.  Orthostatic vitals, consider 1u pRBCs if positive. Lactation consult Remove Foley  Christeen Douglas 10/19/2016, 8:49 AM

## 2016-10-20 MED ORDER — OXYCODONE-ACETAMINOPHEN 5-325 MG PO TABS: 1.0000 | ORAL_TABLET | ORAL | 0 refills | Status: DC | PRN

## 2016-10-20 MED ORDER — TETANUS-DIPHTH-ACELL PERTUSSIS 5-2.5-18.5 LF-MCG/0.5 IM SUSP: 0.5000 mL | Freq: Once | INTRAMUSCULAR | Status: DC

## 2016-10-20 MED ORDER — VARICELLA VIRUS VACCINE LIVE 1350 PFU/0.5ML IJ SUSR
0.5000 mL | Freq: Once | INTRAMUSCULAR | Status: AC
  Administered 2016-10-20: 0.5 mL via SUBCUTANEOUS
  Filled 2016-10-20 (×2): qty 0.5

## 2016-10-20 NOTE — Progress Notes (Addendum)
Patient discharged home with infant and significant other. Discharge instructions, prescriptions, hygiene kit and follow up appointment given to and reviewed with patient and significant other. Patient verbalized understanding. Escorted out via wheelchair by auxiliary.  

## 2016-10-20 NOTE — Discharge Instructions (Signed)
Anemia, Nonspecific Anemia is a condition in which the concentration of red blood cells or hemoglobin in the blood is below normal. Hemoglobin is a substance in red blood cells that carries oxygen to the tissues of the body. Anemia results in not enough oxygen reaching these tissues. What are the causes? Common causes of anemia include:  Excessive bleeding. Bleeding may be internal or external. This includes excessive bleeding from periods (in women) or from the intestine.  Poor nutrition.  Chronic kidney, thyroid, and liver disease.  Bone marrow disorders that decrease red blood cell production.  Cancer and treatments for cancer.  HIV, AIDS, and their treatments.  Spleen problems that increase red blood cell destruction.  Blood disorders.  Excess destruction of red blood cells due to infection, medicines, and autoimmune disorders. What are the signs or symptoms?  Minor weakness.  Dizziness.  Headache.  Palpitations.  Shortness of breath, especially with exercise.  Paleness.  Cold sensitivity.  Indigestion.  Nausea.  Difficulty sleeping.  Difficulty concentrating. Symptoms may occur suddenly or they may develop slowly. How is this diagnosed? Additional blood tests are often needed. These help your health care provider determine the best treatment. Your health care provider will check your stool for blood and look for other causes of blood loss. How is this treated? Treatment varies depending on the cause of the anemia. Treatment can include:  Supplements of iron, vitamin B12, or folic acid.  Hormone medicines.  A blood transfusion. This may be needed if blood loss is severe.  Hospitalization. This may be needed if there is significant continual blood loss.  Dietary changes.  Spleen removal. Follow these instructions at home: Keep all follow-up appointments. It often takes many weeks to correct anemia, and having your health care provider check on your  condition and your response to treatment is very important. Get help right away if:  You develop extreme weakness, shortness of breath, or chest pain.  You become dizzy or have trouble concentrating.  You develop heavy vaginal bleeding.  You develop a rash.  You have bloody or black, tarry stools.  You faint.  You vomit up blood.  You vomit repeatedly.  You have abdominal pain.  You have a fever or persistent symptoms for more than 2-3 days.  You have a fever and your symptoms suddenly get worse.  You are dehydrated. This information is not intended to replace advice given to you by your health care provider. Make sure you discuss any questions you have with your health care provider. Document Released: 03/18/2004 Document Revised: 07/23/2015 Document Reviewed: 08/04/2012 Elsevier Interactive Patient Education  2017 Elsevier Inc.  

## 2016-10-20 NOTE — Progress Notes (Signed)
Subjective: Postpartum Day 2/POD#2 Cesarean Delivery Patient reports wanting to go home. Breastfeeding well  Objective: Vital signs in last 24 hours: Temp:  [97.7 F (36.5 C)-98.6 F (37 C)] 98.6 F (37 C) (08/29 0738) Pulse Rate:  [81-101] 95 (08/29 0738) Resp:  [18] 18 (08/28 1921) BP: (94-120)/(50-69) 94/54 (08/29 0738) SpO2:  [99 %-100 %] 99 % (08/29 0738)  Physical Exam:  General: alert, cooperative and appears stated age  Heart: S1S2, RRR, No M/R/G Lungs: CTA bilat, no W/R/G Lochia: mod, no clots Uterine Fundus: firm, U-1 Incision: healing well DVT Evaluation: No evidence of DVT seen on physical exam.   Recent Labs  10/18/16 0626 10/19/16 0452  HGB 10.8* 7.7*  HCT 30.9* 22.6*    Assessment/Plan: Status post Cesarean section. Doing well postoperatively.  Discharge home with standard precautions and return to clinic in 2 weeks. P: Breastfeeding well. Unsure what to use for Birth control and will decide at 6 weeks.   Sharee Pimple 10/20/2016, 8:53 AM

## 2016-11-08 ENCOUNTER — Other Ambulatory Visit: Payer: Medicaid Other

## 2017-01-31 ENCOUNTER — Ambulatory Visit: Payer: Medicaid Other | Admitting: Oncology

## 2017-01-31 ENCOUNTER — Other Ambulatory Visit: Payer: Medicaid Other

## 2017-04-08 ENCOUNTER — Other Ambulatory Visit: Payer: Self-pay

## 2017-04-08 ENCOUNTER — Emergency Department
Admission: EM | Admit: 2017-04-08 | Discharge: 2017-04-08 | Disposition: A | Discharge status: Home / Self Care | Payer: Medicaid Other | Attending: Emergency Medicine | Admitting: Emergency Medicine

## 2017-04-08 ENCOUNTER — Emergency Department: Payer: Medicaid Other

## 2017-04-08 ENCOUNTER — Encounter: Payer: Self-pay | Admitting: Emergency Medicine

## 2017-04-08 DIAGNOSIS — R102 Pelvic and perineal pain: Secondary | ICD-10-CM | POA: Diagnosis not present

## 2017-04-08 DIAGNOSIS — Z3A18 18 weeks gestation of pregnancy: Secondary | ICD-10-CM | POA: Diagnosis not present

## 2017-04-08 DIAGNOSIS — R42 Dizziness and giddiness: Secondary | ICD-10-CM | POA: Insufficient documentation

## 2017-04-08 DIAGNOSIS — N3000 Acute cystitis without hematuria: Secondary | ICD-10-CM

## 2017-04-08 DIAGNOSIS — O26899 Other specified pregnancy related conditions, unspecified trimester: Secondary | ICD-10-CM

## 2017-04-08 DIAGNOSIS — O26812 Pregnancy related exhaustion and fatigue, second trimester: Secondary | ICD-10-CM | POA: Diagnosis not present

## 2017-04-08 DIAGNOSIS — O2312 Infections of bladder in pregnancy, second trimester: Secondary | ICD-10-CM | POA: Diagnosis not present

## 2017-04-08 LAB — URINALYSIS, COMPLETE (UACMP) WITH MICROSCOPIC
BACTERIA UA: NONE SEEN
BILIRUBIN URINE: NEGATIVE
Glucose, UA: NEGATIVE mg/dL
HGB URINE DIPSTICK: NEGATIVE
KETONES UR: NEGATIVE mg/dL
Nitrite: NEGATIVE
PROTEIN: NEGATIVE mg/dL
Specific Gravity, Urine: 1.021 (ref 1.005–1.030)
pH: 7 (ref 5.0–8.0)

## 2017-04-08 LAB — POC URINE PREG, ED: Preg Test, Ur: POSITIVE — AB

## 2017-04-08 LAB — COMPREHENSIVE METABOLIC PANEL
ALT: 10 U/L — AB (ref 14–54)
AST: 17 U/L (ref 15–41)
Albumin: 3.4 g/dL — ABNORMAL LOW (ref 3.5–5.0)
Alkaline Phosphatase: 33 U/L — ABNORMAL LOW (ref 38–126)
Anion gap: 7 (ref 5–15)
BUN: 10 mg/dL (ref 6–20)
CHLORIDE: 106 mmol/L (ref 101–111)
CO2: 23 mmol/L (ref 22–32)
CREATININE: 0.52 mg/dL (ref 0.44–1.00)
Calcium: 8.7 mg/dL — ABNORMAL LOW (ref 8.9–10.3)
GFR calc non Af Amer: >60 mL/min (ref >60)
Glucose, Bld: 94 mg/dL (ref 65–99)
Potassium: 3.8 mmol/L (ref 3.5–5.1)
SODIUM: 136 mmol/L (ref 135–145)
Total Bilirubin: 0.4 mg/dL (ref 0.3–1.2)
Total Protein: 6.8 g/dL (ref 6.5–8.1)

## 2017-04-08 LAB — HCG, QUANTITATIVE, PREGNANCY: hCG, Beta Chain, Quant, S: 22859 m[IU]/mL — ABNORMAL HIGH (ref <5)

## 2017-04-08 LAB — CBC
HCT: 36.7 % (ref 35.0–47.0)
Hemoglobin: 12.7 g/dL (ref 12.0–16.0)
MCH: 30.3 pg (ref 26.0–34.0)
MCHC: 34.5 g/dL (ref 32.0–36.0)
MCV: 87.9 fL (ref 80.0–100.0)
PLATELETS: 274 10*3/uL (ref 150–440)
RBC: 4.17 MIL/uL (ref 3.80–5.20)
RDW: 15.6 % — ABNORMAL HIGH (ref 11.5–14.5)
WBC: 10.1 10*3/uL (ref 3.6–11.0)

## 2017-04-08 LAB — LIPASE, BLOOD: LIPASE: 21 U/L (ref 11–51)

## 2017-04-08 MED ORDER — NITROFURANTOIN MONOHYD MACRO 100 MG PO CAPS
100.0000 mg | ORAL_CAPSULE | Freq: Once | ORAL | Status: AC
  Administered 2017-04-08: 100 mg via ORAL
  Filled 2017-04-08 (×2): qty 1

## 2017-04-08 MED ORDER — NITROFURANTOIN MONOHYD MACRO 100 MG PO CAPS: 100.0000 mg | ORAL_CAPSULE | Freq: Two times a day (BID) | ORAL | 0 refills | Status: AC

## 2017-04-08 MED ORDER — PRENATAL 27-0.8 MG PO TABS: 1.0000 | ORAL_TABLET | Freq: Every day | ORAL | 0 refills | Status: AC

## 2017-04-08 NOTE — ED Triage Notes (Signed)
Pt to ED c/o feeling dizzy when she woke up this morning, unbalanced feeling.  States mild pain with urination and some frequency.  Denies falling or LOC.

## 2017-04-08 NOTE — ED Provider Notes (Signed)
[facility] Emergency Department Provider Note  ____________________________________________  Time seen: Approximately 6:47 PM  I have reviewed the triage vital signs and the nursing notes.   HISTORY  Chief Complaint Dizziness    HPI Amanda Welch is a 22 y.o. female G3P2 with unknown last menstrual cycle, pregnant here, presenting with a lower abdominal discomfort and urinary frequency.  The patient reports that over the last several days she has had some urinary frequency without significant dysuria.  She is also had some pelvic pressure.  This morning, the patient had a lightheaded sensation with ambulation.  She has not had any fevers, nausea or vomiting, change in vaginal discharge.  She gave birth in August 2018 and is breast-feeding her infant; she is not using any birth control.  She has had intermittent spotting but no true menstrual period since her delivery.  Past Medical History:  Diagnosis Date  . Anemia   . Preeclampsia    with G1    Patient Active Problem List   Diagnosis Date Noted  . Supervision of normal pregnancy in third trimester 10/18/2016  . Labor and delivery, indication for care 09/14/2016  . Anemia affecting pregnancy in third trimester 08/07/2016  . Abdominal pain affecting pregnancy, antepartum 07/28/2016  . GBS (group b Streptococcus) UTI complicating pregnancy, second trimester 07/26/2016  . Indication for care in labor and delivery, antepartum 07/26/2016  . Nausea and vomiting during pregnancy 07/23/2016  . Lower abdominal pain 07/23/2016  . Pregnancy with history of cesarean section, antepartum 07/23/2016  . Insufficient prenatal care in second trimester 07/23/2016  . Hx of preeclampsia, prior pregnancy, currently pregnant 07/23/2016    Past Surgical History:  Procedure Laterality Date  . CESAREAN SECTION  06/26/2013  . CESAREAN SECTION N/A 10/18/2016   Procedure: CESAREAN SECTION - REPEAT;  Surgeon: Christeen Douglas,  MD;  Location: [facility] ORS;  Service: Obstetrics;  Laterality: N/A;    Current Outpatient Rx  . Order #: [id number] Class: OTC  . Order #: [id number] Class: Print  . Order #: [id number] Class: Print  . Order #: [id number] Class: Print  . Order #: [id number] Class: Print    Allergies Patient has no known allergies.  Family History  Problem Relation Age of Onset  . Diabetes Maternal Grandfather   . Breast cancer Neg Hx   . Ovarian cancer Neg Hx     Social History Social History   Tobacco Use  . Smoking status: Never Smoker  . Smokeless tobacco: Never Used  Substance Use Topics  . Alcohol use: No  . Drug use: No    Review of Systems Constitutional: No fever/chills.  Lightheadedness or syncope. Eyes: No visual changes. ENT: No sore throat. No congestion or rhinorrhea. Cardiovascular: Denies chest pain. Denies palpitations. Respiratory: Denies shortness of breath.  No cough. Gastrointestinal: No abdominal pain.  No nausea, no vomiting.  No diarrhea.  No constipation. Genitourinary: Negative for dysuria.  Positive for urinary frequency.  No vaginal bleeding.  Positive for lower pelvic discomfort. Musculoskeletal: Negative for back pain. Skin: Negative for rash. Neurological: Negative for headaches. No focal numbness, tingling or weakness.     ____________________________________________   PHYSICAL EXAM:  VITAL SIGNS: ED Triage Vitals [04/08/17 1551]  Enc Vitals Group     BP 128/65     Pulse Rate 99     Resp 16     Temp 98.9 F (37.2 C)     Temp Source Oral     SpO2 97 %     Weight 115 lb (52.2  kg)     Height 4\' 11"  (1.499 m)     Head Circumference      Peak Flow      Pain Score      Pain Loc      Pain Edu?      Excl. in GC?     Constitutional: Alert and oriented. Well appearing and in no acute distress. Answers questions appropriately. Eyes: Conjunctivae are normal.  EOMI. No scleral icterus. Head: Atraumatic. Nose: No congestion/rhinnorhea. Mouth/Throat:  Mucous membranes are moist.  Neck: No stridor.  Supple.   Cardiovascular: Normal rate, regular rhythm. No murmurs, rubs or gallops.  Respiratory: Normal respiratory effort.  No accessory muscle use or retractions. Lungs CTAB.  No wheezes, rales or ronchi. Gastrointestinal: Soft, nontender.  The patient has a palpable mass, likely the uterine fundus, to 2 cm above the umbilicus with what is likely fetal movement on my examination.  No guarding or rebound.  No peritoneal signs. Genitourinary: .Normal-appearing external genitalia without lesions. Normal vaginal exam with physiologic discharge, normal-appearing cervix, normal vaginal wall tissue. Bimanual exam is negative for CMT, adnexal tenderness to palpation, no palpable masses Musculoskeletal: No LE edema. Neurologic:  A&Ox3.  Speech is clear.  Face and smile are symmetric.  EOMI.  Moves all extremities well. Skin:  Skin is warm, dry and intact. No rash noted. Psychiatric: Mood and affect are normal. Speech and behavior are normal.  Normal judgement.  ____________________________________________   LABS (all labs ordered are listed, but only abnormal results are displayed)  Labs Reviewed  COMPREHENSIVE METABOLIC PANEL - Abnormal; Notable for the following components:      Result Value   Calcium 8.7 (*)    Albumin 3.4 (*)    ALT 10 (*)    Alkaline Phosphatase 33 (*)    All other components within normal limits  CBC - Abnormal; Notable for the following components:   RDW 15.6 (*)    All other components within normal limits  URINALYSIS, COMPLETE (UACMP) WITH MICROSCOPIC - Abnormal; Notable for the following components:   Color, Urine YELLOW (*)    APPearance TURBID (*)    Leukocytes, UA MODERATE (*)    Squamous Epithelial / LPF 0-5 (*)    All other components within normal limits  HCG, QUANTITATIVE, PREGNANCY - Abnormal; Notable for the following components:   hCG, Beta Chain, Quant, S 22,859 (*)    All other components within normal  limits  POC URINE PREG, ED - Abnormal; Notable for the following components:   Preg Test, Ur Positive (*)    All other components within normal limits  CHLAMYDIA/NGC RT PCR ([facility] ONLY)  WET PREP, GENITAL  LIPASE, BLOOD   ____________________________________________  EKG  Not indicated ____________________________________________  RADIOLOGY  US Ob Limited  Result Date: 04/08/2017 CLINICAL DATA:  Pelvic pain with positive pregnancy test. EXAM: LIMITED OBSTETRIC ULTRASOUND COMPARISON:  No comparison studies available. FINDINGS: Number of Fetuses: 1 Heart Rate:  143 bpm Movement: Visualized. Presentation: Cephalic Placental Location: Anterior.  Small placental Lake evident. Previa: No Amniotic Fluid (Subjective):  Within normal limits. BPD: 4.2 cm 18 w  6 d MATERNAL FINDINGS: Cervix:  Appears closed.  3.8 cm length. Uterus/Adnexae: No abnormality visualized. IMPRESSION: Single living intrauterine gestation at estimated 18 week 6 day gestational age by BPD. This exam is performed on an emergent basis and does not comprehensively evaluate fetal size, dating, or anatomy; follow-up complete OB US should be considered if further fetal assessment is warranted. Electronically Signed  By: Kennith CenterEric  Mansell M.D.   On: 04/08/2017 19:58    ____________________________________________   PROCEDURES  Procedure(s) performed: None  Procedures  Critical Care performed: No ____________________________________________   INITIAL IMPRESSION / ASSESSMENT AND PLAN / ED COURSE  Pertinent labs & imaging results that were available during my care of the patient were reviewed by me and considered in my medical decision making (see chart for details).  [age] y.o. G3 P2 with unknown LMP presenting for urinary symptoms and lower pelvic pain, with a palpable uterine fundus above the umbilicus.  The patient does have white blood cells and leukocyte esterase in her urine so we will treat her for UTI and  pregnancy.  It is likely that her pregnancy is out of the first trimester but will get an ultrasound for further evaluation.  We will also check her for any evidence of vaginal infection.  I do not see any evidence of appendicitis on my examination.  Plan reevaluation for final disposition.  ----------------------------------------- 8:05 PM on 04/08/2017 -----------------------------------------  The patient's ultrasound does show a single IUP that is 18 weeks 6 days with normal fetal heart rate.  [facility] who will see the patient for outpatient evaluation.  I will discharge the patient home with prenatal vitamin and antibiotics for her UTI.  At this time, the patient is stable for discharge.  Return precautions and follow-up instructions were discussed.  ____________________________________________  FINAL CLINICAL IMPRESSION(S) / ED DIAGNOSES  Final diagnoses:  Lightheadedness  Acute cystitis without hematuria  [redacted] weeks gestation of pregnancy         NEW MEDICATIONS STARTED DURING THIS VISIT:  New Prescriptions   NITROFURANTOIN, MACROCRYSTAL-MONOHYDRATE, (MACROBID) 100 MG CAPSULE    Take 1 capsule (100 mg total) by mouth 2 (two) times daily for 7 days.   PRENATAL VIT-FE FUMARATE-FA (MULTIVITAMIN-PRENATAL) 27-0.8 MG TABS TABLET    Take 1 tablet by mouth daily at 12 noon.      Rockne MenghiniNorman, Anne-Caroline, MD 04/08/17 2006

## 2017-04-08 NOTE — Discharge Instructions (Signed)
Please start taking your prenatal vitamin.  Please take the entire course of antibiotics, even if you are feeling better.  Please make an appointment with the OB/GYN to establish care for this pregnancy.  Return to the emergency department if you develop severe pain, vaginal bleeding, lightheadedness or fainting, fever, or any other symptoms concerning to you.

## 2017-05-24 LAB — OB RESULTS CONSOLE VARICELLA ZOSTER ANTIBODY, IGG: Varicella: NON-IMMUNE/NOT IMMUNE

## 2017-05-24 LAB — OB RESULTS CONSOLE HEPATITIS B SURFACE ANTIGEN: Hepatitis B Surface Ag: NEGATIVE

## 2017-05-24 LAB — OB RESULTS CONSOLE GC/CHLAMYDIA
Chlamydia: NEGATIVE
GC PROBE AMP, GENITAL: NEGATIVE

## 2017-05-24 LAB — OB RESULTS CONSOLE GBS: STREP GROUP B AG: NEGATIVE

## 2017-05-24 LAB — OB RESULTS CONSOLE HIV ANTIBODY (ROUTINE TESTING): HIV: NONREACTIVE

## 2017-05-24 LAB — OB RESULTS CONSOLE RUBELLA ANTIBODY, IGM: RUBELLA: IMMUNE

## 2017-08-03 ENCOUNTER — Inpatient Hospital Stay
Admission: EM | Admit: 2017-08-03 | Discharge: 2017-08-07 | DRG: 786 | Disposition: A | Discharge status: Home / Self Care | Payer: Medicaid Other | Attending: Obstetrics and Gynecology | Admitting: Obstetrics and Gynecology

## 2017-08-03 ENCOUNTER — Other Ambulatory Visit: Payer: Self-pay

## 2017-08-03 DIAGNOSIS — R102 Pelvic and perineal pain: Secondary | ICD-10-CM

## 2017-08-03 DIAGNOSIS — O26899 Other specified pregnancy related conditions, unspecified trimester: Secondary | ICD-10-CM

## 2017-08-03 DIAGNOSIS — D62 Acute posthemorrhagic anemia: Secondary | ICD-10-CM | POA: Diagnosis not present

## 2017-08-03 DIAGNOSIS — R109 Unspecified abdominal pain: Secondary | ICD-10-CM

## 2017-08-03 DIAGNOSIS — O26893 Other specified pregnancy related conditions, third trimester: Secondary | ICD-10-CM | POA: Diagnosis present

## 2017-08-03 DIAGNOSIS — Z9889 Other specified postprocedural states: Secondary | ICD-10-CM

## 2017-08-03 DIAGNOSIS — O9989 Other specified diseases and conditions complicating pregnancy, childbirth and the puerperium: Secondary | ICD-10-CM

## 2017-08-03 DIAGNOSIS — M549 Dorsalgia, unspecified: Secondary | ICD-10-CM

## 2017-08-03 DIAGNOSIS — O34211 Maternal care for low transverse scar from previous cesarean delivery: Principal | ICD-10-CM | POA: Diagnosis present

## 2017-08-03 DIAGNOSIS — O9081 Anemia of the puerperium: Secondary | ICD-10-CM | POA: Diagnosis not present

## 2017-08-03 DIAGNOSIS — Z3A35 35 weeks gestation of pregnancy: Secondary | ICD-10-CM

## 2017-08-03 LAB — URINALYSIS, ROUTINE W REFLEX MICROSCOPIC
Bilirubin Urine: NEGATIVE
Glucose, UA: NEGATIVE mg/dL
HGB URINE DIPSTICK: NEGATIVE
Ketones, ur: NEGATIVE mg/dL
NITRITE: NEGATIVE
Protein, ur: NEGATIVE mg/dL
SPECIFIC GRAVITY, URINE: 1.01 (ref 1.005–1.030)
pH: 7 (ref 5.0–8.0)

## 2017-08-03 LAB — TYPE AND SCREEN
ABO/RH(D): A POS
ANTIBODY SCREEN: NEGATIVE

## 2017-08-03 LAB — CBC
HEMATOCRIT: 30.4 % — AB (ref 35.0–47.0)
Hemoglobin: 10.4 g/dL — ABNORMAL LOW (ref 12.0–16.0)
MCH: 27.6 pg (ref 26.0–34.0)
MCHC: 34.1 g/dL (ref 32.0–36.0)
MCV: 80.8 fL (ref 80.0–100.0)
PLATELETS: 275 10*3/uL (ref 150–440)
RBC: 3.76 MIL/uL — ABNORMAL LOW (ref 3.80–5.20)
RDW: 14.9 % — ABNORMAL HIGH (ref 11.5–14.5)
WBC: 13.9 10*3/uL — ABNORMAL HIGH (ref 3.6–11.0)

## 2017-08-03 MED ORDER — SULFAMETHOXAZOLE-TRIMETHOPRIM 400-80 MG PO TABS
1.0000 | ORAL_TABLET | Freq: Two times a day (BID) | ORAL | Status: DC
  Administered 2017-08-03: 1 via ORAL
  Filled 2017-08-03: qty 1

## 2017-08-03 MED ORDER — TERBUTALINE SULFATE 1 MG/ML IJ SOLN
0.2500 mg | Freq: Once | INTRAMUSCULAR | Status: AC
  Administered 2017-08-03: 0.25 mg via SUBCUTANEOUS

## 2017-08-03 MED ORDER — MORPHINE SULFATE (PF) 10 MG/ML IV SOLN
INTRAVENOUS | Status: AC
  Filled 2017-08-03: qty 1

## 2017-08-03 MED ORDER — MORPHINE SULFATE (PF) 10 MG/ML IV SOLN
5.0000 mg | Freq: Once | INTRAVENOUS | Status: AC
  Administered 2017-08-03: 5 mg via INTRAVENOUS

## 2017-08-03 MED ORDER — BETAMETHASONE SOD PHOS & ACET 6 (3-3) MG/ML IJ SUSP
INTRAMUSCULAR | Status: AC
  Filled 2017-08-03: qty 1

## 2017-08-03 MED ORDER — TERBUTALINE SULFATE 1 MG/ML IJ SOLN
INTRAMUSCULAR | Status: AC
  Filled 2017-08-03: qty 1

## 2017-08-03 MED ORDER — NIFEDIPINE 10 MG PO CAPS
20.0000 mg | ORAL_CAPSULE | ORAL | Status: DC | PRN
  Administered 2017-08-03 (×2): 20 mg via ORAL
  Filled 2017-08-03 (×2): qty 2

## 2017-08-03 MED ORDER — LACTATED RINGERS IV BOLUS
500.0000 mL | Freq: Once | INTRAVENOUS | Status: AC
  Administered 2017-08-03: 500 mL via INTRAVENOUS

## 2017-08-03 MED ORDER — BETAMETHASONE SOD PHOS & ACET 6 (3-3) MG/ML IJ SUSP
12.0000 mg | INTRAMUSCULAR | Status: AC
  Administered 2017-08-03 – 2017-08-04 (×2): 12 mg via INTRAMUSCULAR

## 2017-08-03 MED ORDER — MORPHINE SULFATE (PF) 10 MG/ML IV SOLN
5.0000 mg | Freq: Once | INTRAVENOUS | Status: AC
  Administered 2017-08-03: 5 mg via INTRAMUSCULAR
  Filled 2017-08-03: qty 1

## 2017-08-03 NOTE — OB Triage Note (Signed)
Pt is a 35 wk G3P2. Pt presents to triage w/ c/o of contractions. Pt states contractions started around 0600 yesterday morning and has progressively gotten worse. Pt states her pain is a 7/10. Pt denies LOF and vaginal bleeding. Pt reports positive fetal movement. Monitors applied and assessing.

## 2017-08-03 NOTE — H&P (Signed)
Amanda Welch is a 22 y.o. female. She is at Unknown gestation.  LMP 01/17/18 Unknown Estimated Date of Delivery: 09/03/17  [facility]   Chief complaint: back cramping pain  Location: back, low pelvis Onset/timing: about a day ago Duration: x1 day Quality: constant but crampy Severity: moderate to severe Aggravating or alleviating conditions: nothing makes better or worse Associated signs/symptoms: no VB LOF +FM,  Context: patient called office with contractions and back pain, about every 5-10 minutes.  Says it fees like when she had pitocin with her first pregnancy  Has history of 2 prior cesareans. One hx of preecampsia    Maternal Medical History:   Past Medical History:  Diagnosis Date  . Anemia   . Preeclampsia    with G1    Past Surgical History:  Procedure Laterality Date  . CESAREAN SECTION  06/26/2013  . CESAREAN SECTION N/A 10/18/2016   Procedure: CESAREAN SECTION - REPEAT;  Surgeon: Christeen DouglasBeasley, Bethany, MD;  Location: [facility] ORS;  Service: Obstetrics;  Laterality: N/A;    No Known Allergies  Prior to Admission medications   Medication Sig Start Date End Date Taking? Authorizing Provider  ferrous sulfate 325 (65 FE) MG tablet Take 1 tablet (325 mg total) by mouth 2 (two) times daily with a meal. 07/28/16  Yes Conard NovakJackson, Stephen D, MD  Prenatal Vit-Fe Fumarate-FA (MULTIVITAMIN-PRENATAL) 27-0.8 MG TABS tablet Take 1 tablet by mouth daily at 12 noon. 04/08/17  Yes Rockne MenghiniNorman, Anne-Caroline, MD  Prenatal-DSS-FeCb-FeGl-FA (CITRANATAL BLOOM) 90-1 MG TABS Take 1 tablet by mouth daily. 07/23/16  Yes Farrel ConnersGutierrez, Colleen, CNM  oxyCODONE-acetaminophen (PERCOCET/ROXICET) 5-325 MG tablet Take 1-2 tablets by mouth every 4 (four) hours as needed (pain scale > 7). Patient not taking: Reported on 08/03/2017 10/20/16   Sharee PimpleJones, Caron W, CNM     Social History: She  reports that she has never smoked. She has never used smokeless tobacco. She reports that she does not  drink alcohol or use drugs.  Family History: family history includes Diabetes in her maternal grandfather.   Review of Systems: A full review of systems was performed and negative except as noted in the HPI.     O:  BP (!) 123/56 (BP Location: Left Arm)   Pulse (!) 115   Temp 99 F (37.2 C) (Oral)   Resp 18   Ht 4\' 11"  (1.499 m)   Wt 59 kg (130 lb)   LMP  (LMP Unknown)   BMI 26.26 kg/m  Results for orders placed or performed during the hospital encounter of 08/03/17 (from the past 48 hour(s))  Urinalysis, Routine w reflex microscopic   Collection Time: 08/03/17  7:52 PM  Result Value Ref Range   Color, Urine YELLOW (A) YELLOW   APPearance CLOUDY (A) CLEAR   Specific Gravity, Urine 1.010 1.005 - 1.030   pH 7.0 5.0 - 8.0   Glucose, UA NEGATIVE NEGATIVE mg/dL   Hgb urine dipstick NEGATIVE NEGATIVE   Bilirubin Urine NEGATIVE NEGATIVE   Ketones, ur NEGATIVE NEGATIVE mg/dL   Protein, ur NEGATIVE NEGATIVE mg/dL   Nitrite NEGATIVE NEGATIVE   Leukocytes, UA LARGE (A) NEGATIVE   RBC / HPF 0-5 0 - 5 RBC/hpf   WBC, UA 21-50 0 - 5 WBC/hpf   Bacteria, UA RARE (A) NONE SEEN   Squamous Epithelial / LPF 6-10 0 - 5   Mucus PRESENT   CBC   Collection Time: 08/03/17 10:20 PM  Result Value Ref Range   WBC 13.9 (H) 3.6 -  11.0 K/uL   RBC 3.76 (L) 3.80 - 5.20 MIL/uL   Hemoglobin 10.4 (L) 12.0 - 16.0 g/dL   HCT 16.1 (L) 09.6 - 04.5 %   MCV 80.8 80.0 - 100.0 fL   MCH 27.6 26.0 - 34.0 pg   MCHC 34.1 32.0 - 36.0 g/dL   RDW 40.9 (H) 81.1 - 91.4 %   Platelets 275 150 - 440 K/uL  Type and screen [facility]   Collection Time: 08/03/17 10:20 PM  Result Value Ref Range   ABO/RH(D) PENDING    Antibody Screen PENDING    Sample Expiration      08/06/2017 Performed at [facility], [address] [place], [place] [address]      Constitutional: NAD, AAOx3  HE/ENT: extraocular movements grossly intact, moist mucous membranes CV: RRR PULM: nl respiratory  effort, CTABL Back: no CVA tenderness   Abd: gravid, non-tender, non-distended, soft      Ext: Non-tender, Nonedmeatous  Psych: mood appropriate, speech normal Pelvic 1/50/-3 per RN  NST:  Baseline: 135 Variability: moderate Accelerations present x >2 Decelerations absent   A/P: 22 y.o. @ 35+4 with back pain and contractions - rule out preterm labor  1. Keep in OBS 2. Terbutaline, will consider procardia if unsuccessful. Pain should subside with relief from contractions. 3. BMZ  If ctx do not abate, will have NICU team consulted to discuss risks and expectations of preterm delivery w patient. 4. Run UA 5. Start IVF bolus 6. Keep close eye - has 2 prior cesareans, with early pain like this could be uterine scar separation, concealed abruption, or infection.  As of now there are no indications of fetal compromise.  Explained to patient if preterm labor would prefer to keep her pregnant until term, however if there is further indication of necessity for delivery will at least try to get steroids on board if possible.  ----- Ranae Plumber, MD Attending Obstetrician and Gynecologist [facility], Department of OB/GYN [facility]

## 2017-08-04 ENCOUNTER — Observation Stay: Payer: Medicaid Other | Admitting: Anesthesiology

## 2017-08-04 ENCOUNTER — Observation Stay: Payer: Medicaid Other

## 2017-08-04 ENCOUNTER — Encounter
Admission: EM | Disposition: A | Discharge status: Home / Self Care | Payer: Self-pay | Source: Home / Self Care | Attending: Obstetrics and Gynecology

## 2017-08-04 DIAGNOSIS — R109 Unspecified abdominal pain: Secondary | ICD-10-CM

## 2017-08-04 DIAGNOSIS — M545 Low back pain: Secondary | ICD-10-CM | POA: Diagnosis present

## 2017-08-04 DIAGNOSIS — Z3A35 35 weeks gestation of pregnancy: Secondary | ICD-10-CM | POA: Diagnosis not present

## 2017-08-04 DIAGNOSIS — Z9889 Other specified postprocedural states: Secondary | ICD-10-CM

## 2017-08-04 DIAGNOSIS — O9081 Anemia of the puerperium: Secondary | ICD-10-CM | POA: Diagnosis not present

## 2017-08-04 DIAGNOSIS — O34211 Maternal care for low transverse scar from previous cesarean delivery: Secondary | ICD-10-CM | POA: Diagnosis present

## 2017-08-04 DIAGNOSIS — O26893 Other specified pregnancy related conditions, third trimester: Secondary | ICD-10-CM | POA: Diagnosis present

## 2017-08-04 DIAGNOSIS — D62 Acute posthemorrhagic anemia: Secondary | ICD-10-CM | POA: Diagnosis not present

## 2017-08-04 LAB — COMPREHENSIVE METABOLIC PANEL
ALK PHOS: 110 U/L (ref 38–126)
ALT: 16 U/L (ref 14–54)
AST: 27 U/L (ref 15–41)
Albumin: 2.8 g/dL — ABNORMAL LOW (ref 3.5–5.0)
Anion gap: 7 (ref 5–15)
BUN: 6 mg/dL (ref 6–20)
CALCIUM: 8.6 mg/dL — AB (ref 8.9–10.3)
CO2: 19 mmol/L — AB (ref 22–32)
CREATININE: 0.55 mg/dL (ref 0.44–1.00)
Chloride: 112 mmol/L — ABNORMAL HIGH (ref 101–111)
GFR calc Af Amer: >60 mL/min (ref >60)
GFR calc non Af Amer: >60 mL/min (ref >60)
GLUCOSE: 115 mg/dL — AB (ref 65–99)
Potassium: 4 mmol/L (ref 3.5–5.1)
SODIUM: 138 mmol/L (ref 135–145)
Total Bilirubin: 0.5 mg/dL (ref 0.3–1.2)
Total Protein: 6 g/dL — ABNORMAL LOW (ref 6.5–8.1)

## 2017-08-04 SURGERY — Surgical Case
Anesthesia: Choice | Site: Abdomen | Wound class: Clean Contaminated

## 2017-08-04 MED ORDER — PHENYLEPHRINE HCL 10 MG/ML IJ SOLN
INTRAMUSCULAR | Status: DC | PRN
  Administered 2017-08-04 (×2): 100 ug via INTRAVENOUS
  Administered 2017-08-04: 50 ug via INTRAVENOUS

## 2017-08-04 MED ORDER — COCONUT OIL OIL: 1.0000 "application " | TOPICAL_OIL | Status: DC | PRN

## 2017-08-04 MED ORDER — OXYTOCIN 40 UNITS IN LACTATED RINGERS INFUSION - SIMPLE MED
INTRAVENOUS | Status: DC | PRN
  Administered 2017-08-04: 500 mL via INTRAVENOUS

## 2017-08-04 MED ORDER — PROPOFOL 10 MG/ML IV BOLUS
INTRAVENOUS | Status: AC
  Filled 2017-08-04: qty 20

## 2017-08-04 MED ORDER — IBUPROFEN 600 MG PO TABS
600.0000 mg | ORAL_TABLET | Freq: Four times a day (QID) | ORAL | Status: DC
  Administered 2017-08-04 – 2017-08-07 (×10): 600 mg via ORAL
  Filled 2017-08-04 (×10): qty 1

## 2017-08-04 MED ORDER — METHYLERGONOVINE MALEATE 0.2 MG/ML IJ SOLN
INTRAMUSCULAR | Status: AC
  Filled 2017-08-04: qty 1

## 2017-08-04 MED ORDER — DOCUSATE SODIUM 100 MG PO CAPS
100.0000 mg | ORAL_CAPSULE | Freq: Two times a day (BID) | ORAL | Status: DC
  Administered 2017-08-04 – 2017-08-07 (×6): 100 mg via ORAL
  Filled 2017-08-04 (×5): qty 1

## 2017-08-04 MED ORDER — WITCH HAZEL-GLYCERIN EX PADS: 1.0000 "application " | MEDICATED_PAD | CUTANEOUS | Status: DC | PRN

## 2017-08-04 MED ORDER — MORPHINE SULFATE (PF) 0.5 MG/ML IJ SOLN
INTRAMUSCULAR | Status: DC | PRN
  Administered 2017-08-04: .1 mg via INTRATHECAL

## 2017-08-04 MED ORDER — METHYLERGONOVINE MALEATE 0.2 MG/ML IJ SOLN
INTRAMUSCULAR | Status: DC | PRN
  Administered 2017-08-04: 0.2 mg via INTRAMUSCULAR

## 2017-08-04 MED ORDER — BUPIVACAINE LIPOSOME 1.3 % IJ SUSP: 20.0000 mL | Freq: Once | INTRAMUSCULAR | Status: DC

## 2017-08-04 MED ORDER — ONDANSETRON HCL 4 MG/2ML IJ SOLN
INTRAMUSCULAR | Status: AC
  Filled 2017-08-04: qty 2

## 2017-08-04 MED ORDER — BUPIVACAINE HCL (PF) 0.5 % IJ SOLN
INTRAMUSCULAR | Status: DC | PRN
  Administered 2017-08-04: 60 mL

## 2017-08-04 MED ORDER — OXYCODONE-ACETAMINOPHEN 5-325 MG PO TABS
2.0000 | ORAL_TABLET | ORAL | Status: DC | PRN
  Administered 2017-08-05: 2 via ORAL
  Filled 2017-08-04: qty 2

## 2017-08-04 MED ORDER — NIFEDIPINE 10 MG PO CAPS
10.0000 mg | ORAL_CAPSULE | Freq: Four times a day (QID) | ORAL | Status: DC
  Administered 2017-08-04 (×2): 10 mg via ORAL
  Filled 2017-08-04 (×2): qty 1

## 2017-08-04 MED ORDER — DIBUCAINE 1 % RE OINT: 1.0000 "application " | TOPICAL_OINTMENT | RECTAL | Status: DC | PRN

## 2017-08-04 MED ORDER — ZOLPIDEM TARTRATE 5 MG PO TABS: 5.0000 mg | ORAL_TABLET | Freq: Every evening | ORAL | Status: DC | PRN

## 2017-08-04 MED ORDER — SIMETHICONE 80 MG PO CHEW: 80.0000 mg | CHEWABLE_TABLET | ORAL | Status: DC | PRN

## 2017-08-04 MED ORDER — MIDAZOLAM HCL 2 MG/2ML IJ SOLN
INTRAMUSCULAR | Status: AC
  Filled 2017-08-04: qty 2

## 2017-08-04 MED ORDER — SODIUM CHLORIDE FLUSH 0.9 % IV SOLN
INTRAVENOUS | Status: AC
  Filled 2017-08-04: qty 10

## 2017-08-04 MED ORDER — LACTATED RINGERS IV SOLN: INTRAVENOUS | Status: DC

## 2017-08-04 MED ORDER — OXYCODONE HCL 5 MG PO TABS
5.0000 mg | ORAL_TABLET | ORAL | Status: DC | PRN
Start: 2017-08-04 — End: 2017-08-05
  Administered 2017-08-04 (×2): 5 mg via ORAL
  Filled 2017-08-04 (×2): qty 1

## 2017-08-04 MED ORDER — BETAMETHASONE SOD PHOS & ACET 6 (3-3) MG/ML IJ SUSP
INTRAMUSCULAR | Status: AC
  Administered 2017-08-04: 12 mg via INTRAMUSCULAR
  Filled 2017-08-04: qty 1

## 2017-08-04 MED ORDER — PROMETHAZINE HCL 25 MG/ML IJ SOLN
INTRAMUSCULAR | Status: AC
  Filled 2017-08-04: qty 1

## 2017-08-04 MED ORDER — BUPIVACAINE LIPOSOME 1.3 % IJ SUSP
INTRAMUSCULAR | Status: AC
  Filled 2017-08-04: qty 20

## 2017-08-04 MED ORDER — OXYTOCIN 40 UNITS IN LACTATED RINGERS INFUSION - SIMPLE MED
2.5000 [IU]/h | INTRAVENOUS | Status: AC
  Administered 2017-08-05: 2.5 [IU]/h via INTRAVENOUS
  Filled 2017-08-04: qty 1000

## 2017-08-04 MED ORDER — PRENATAL MULTIVITAMIN CH
1.0000 | ORAL_TABLET | Freq: Every day | ORAL | Status: DC
  Administered 2017-08-05 – 2017-08-06 (×2): 1 via ORAL
  Filled 2017-08-04 (×2): qty 1

## 2017-08-04 MED ORDER — BUPIVACAINE IN DEXTROSE 0.75-8.25 % IT SOLN
INTRATHECAL | Status: DC | PRN
  Administered 2017-08-04: 1.6 mL via INTRATHECAL

## 2017-08-04 MED ORDER — LACTATED RINGERS IV SOLN
INTRAVENOUS | Status: DC
  Administered 2017-08-04: 1000 mL via INTRAVENOUS

## 2017-08-04 MED ORDER — ACETAMINOPHEN 325 MG PO TABS
650.0000 mg | ORAL_TABLET | ORAL | Status: DC | PRN
  Administered 2017-08-05: 650 mg via ORAL
  Filled 2017-08-04: qty 2

## 2017-08-04 MED ORDER — ONDANSETRON HCL 4 MG/2ML IJ SOLN
INTRAMUSCULAR | Status: DC | PRN
  Administered 2017-08-04: 4 mg via INTRAVENOUS

## 2017-08-04 MED ORDER — MORPHINE SULFATE (PF) 0.5 MG/ML IJ SOLN
INTRAMUSCULAR | Status: AC
  Filled 2017-08-04: qty 10

## 2017-08-04 MED ORDER — DIPHENHYDRAMINE HCL 25 MG PO CAPS
25.0000 mg | ORAL_CAPSULE | Freq: Four times a day (QID) | ORAL | Status: DC | PRN
  Administered 2017-08-05 (×2): 25 mg via ORAL
  Filled 2017-08-04 (×2): qty 1

## 2017-08-04 MED ORDER — TETANUS-DIPHTH-ACELL PERTUSSIS 5-2.5-18.5 LF-MCG/0.5 IM SUSP: 0.5000 mL | Freq: Once | INTRAMUSCULAR | Status: DC

## 2017-08-04 MED ORDER — TERBUTALINE SULFATE 1 MG/ML IJ SOLN
0.2500 mg | Freq: Once | INTRAMUSCULAR | Status: AC
  Administered 2017-08-04: 0.25 mg via SUBCUTANEOUS

## 2017-08-04 MED ORDER — SIMETHICONE 80 MG PO CHEW
80.0000 mg | CHEWABLE_TABLET | ORAL | Status: DC
  Administered 2017-08-04 – 2017-08-06 (×3): 80 mg via ORAL
  Filled 2017-08-04 (×2): qty 1

## 2017-08-04 MED ORDER — TERBUTALINE SULFATE 1 MG/ML IJ SOLN
INTRAMUSCULAR | Status: AC
  Administered 2017-08-04: 0.25 mg via SUBCUTANEOUS
  Filled 2017-08-04: qty 1

## 2017-08-04 MED ORDER — MENTHOL 3 MG MT LOZG
1.0000 | LOZENGE | OROMUCOSAL | Status: DC | PRN
  Filled 2017-08-04: qty 9

## 2017-08-04 MED ORDER — OXYCODONE-ACETAMINOPHEN 5-325 MG PO TABS
1.0000 | ORAL_TABLET | ORAL | Status: DC | PRN
  Administered 2017-08-05 – 2017-08-06 (×3): 1 via ORAL
  Filled 2017-08-04 (×3): qty 1

## 2017-08-04 MED ORDER — HYDROMORPHONE HCL 1 MG/ML IJ SOLN
0.5000 mg | Freq: Once | INTRAMUSCULAR | Status: AC
  Administered 2017-08-04: 0.5 mg via INTRAVENOUS

## 2017-08-04 MED ORDER — DOCUSATE SODIUM 100 MG PO CAPS
100.0000 mg | ORAL_CAPSULE | Freq: Two times a day (BID) | ORAL | Status: DC
  Filled 2017-08-04: qty 1

## 2017-08-04 MED ORDER — HYDROMORPHONE HCL 1 MG/ML IJ SOLN
INTRAMUSCULAR | Status: AC
  Filled 2017-08-04: qty 1

## 2017-08-04 MED ORDER — OXYTOCIN 40 UNITS IN LACTATED RINGERS INFUSION - SIMPLE MED
INTRAVENOUS | Status: AC
  Filled 2017-08-04: qty 1000

## 2017-08-04 MED ORDER — BUPIVACAINE HCL (PF) 0.5 % IJ SOLN
INTRAMUSCULAR | Status: AC
  Filled 2017-08-04: qty 30

## 2017-08-04 MED ORDER — CEFAZOLIN SODIUM-DEXTROSE 2-4 GM/100ML-% IV SOLN
2.0000 g | Freq: Once | INTRAVENOUS | Status: DC
  Filled 2017-08-04: qty 100

## 2017-08-04 MED ORDER — SODIUM CHLORIDE 0.9 % IJ SOLN
INTRAMUSCULAR | Status: DC | PRN
  Administered 2017-08-04: 50 mL via INTRAVENOUS

## 2017-08-04 MED ORDER — SOD CITRATE-CITRIC ACID 500-334 MG/5ML PO SOLN
ORAL | Status: AC
  Administered 2017-08-04: 30 mL
  Filled 2017-08-04: qty 15

## 2017-08-04 MED ORDER — SIMETHICONE 80 MG PO CHEW
CHEWABLE_TABLET | ORAL | Status: AC
  Administered 2017-08-04: 80 mg via ORAL
  Filled 2017-08-04: qty 1

## 2017-08-04 MED ORDER — NITROFURANTOIN MONOHYD MACRO 100 MG PO CAPS
100.0000 mg | ORAL_CAPSULE | Freq: Two times a day (BID) | ORAL | Status: DC
  Administered 2017-08-04 – 2017-08-07 (×7): 100 mg via ORAL
  Filled 2017-08-04 (×8): qty 1

## 2017-08-04 MED ORDER — CYCLOBENZAPRINE HCL 10 MG PO TABS
10.0000 mg | ORAL_TABLET | Freq: Three times a day (TID) | ORAL | Status: DC
  Administered 2017-08-04 – 2017-08-07 (×9): 10 mg via ORAL
  Filled 2017-08-04 (×11): qty 1

## 2017-08-04 MED ORDER — SIMETHICONE 80 MG PO CHEW
80.0000 mg | CHEWABLE_TABLET | Freq: Three times a day (TID) | ORAL | Status: DC
  Administered 2017-08-05 – 2017-08-07 (×7): 80 mg via ORAL
  Filled 2017-08-04 (×7): qty 1

## 2017-08-04 MED ORDER — EPHEDRINE SULFATE-NACL 50-0.9 MG/10ML-% IV SOSY
PREFILLED_SYRINGE | INTRAVENOUS | Status: DC | PRN
  Administered 2017-08-04: 10 mg via INTRAVENOUS

## 2017-08-04 MED ORDER — SENNOSIDES-DOCUSATE SODIUM 8.6-50 MG PO TABS
2.0000 | ORAL_TABLET | ORAL | Status: DC
  Administered 2017-08-04 – 2017-08-06 (×3): 2 via ORAL
  Filled 2017-08-04 (×4): qty 2

## 2017-08-04 MED ORDER — PROMETHAZINE HCL 25 MG/ML IJ SOLN
25.0000 mg | Freq: Once | INTRAMUSCULAR | Status: AC
  Administered 2017-08-04: 25 mg via INTRAVENOUS

## 2017-08-04 MED ORDER — MIDAZOLAM HCL 2 MG/2ML IJ SOLN
INTRAMUSCULAR | Status: DC | PRN
  Administered 2017-08-04 (×2): 1 mg via INTRAVENOUS

## 2017-08-04 SURGICAL SUPPLY — 26 items
BARRIER ADHS 3X4 INTERCEED (GAUZE/BANDAGES/DRESSINGS) ×3 IMPLANT
CANISTER SUCT 3000ML PPV (MISCELLANEOUS) ×3 IMPLANT
CHLORAPREP W/TINT 26ML (MISCELLANEOUS) ×3 IMPLANT
DRSG TELFA 3X8 NADH (GAUZE/BANDAGES/DRESSINGS) ×3 IMPLANT
ELECT CAUTERY BLADE 6.4 (BLADE) ×3 IMPLANT
ELECT REM PT RETURN 9FT ADLT (ELECTROSURGICAL) ×3
ELECTRODE REM PT RTRN 9FT ADLT (ELECTROSURGICAL) ×1 IMPLANT
GAUZE SPONGE 4X4 12PLY STRL (GAUZE/BANDAGES/DRESSINGS) ×3 IMPLANT
GLOVE BIO SURGEON STRL SZ8 (GLOVE) ×3 IMPLANT
GOWN STRL REUS W/ TWL LRG LVL3 (GOWN DISPOSABLE) ×2 IMPLANT
GOWN STRL REUS W/ TWL XL LVL3 (GOWN DISPOSABLE) ×1 IMPLANT
GOWN STRL REUS W/TWL LRG LVL3 (GOWN DISPOSABLE) ×4
GOWN STRL REUS W/TWL XL LVL3 (GOWN DISPOSABLE) ×2
NEEDLE HYPO 18GX1.5 BLUNT FILL (NEEDLE) ×3 IMPLANT
NEEDLE HYPO 22GX1.5 SAFETY (NEEDLE) ×6 IMPLANT
NS IRRIG 1000ML POUR BTL (IV SOLUTION) ×3 IMPLANT
PACK C SECTION AR (MISCELLANEOUS) ×3 IMPLANT
PAD OB MATERNITY 4.3X12.25 (PERSONAL CARE ITEMS) ×3 IMPLANT
PAD PREP 24X41 OB/GYN DISP (PERSONAL CARE ITEMS) ×3 IMPLANT
STAPLER INSORB 30 2030 C-SECTI (MISCELLANEOUS) ×3 IMPLANT
STRAP SAFETY 5IN WIDE (MISCELLANEOUS) ×3 IMPLANT
SUCT VACUUM KIWI BELL (SUCTIONS) ×3 IMPLANT
SUT CHROMIC 1 CTX 36 (SUTURE) ×9 IMPLANT
SUT PLAIN GUT 0 (SUTURE) IMPLANT
SUT VIC AB 0 CT1 36 (SUTURE) ×6 IMPLANT
SYR 30ML LL (SYRINGE) ×6 IMPLANT

## 2017-08-04 NOTE — Progress Notes (Signed)
Overnight events:  Patient was given terbutaline x1 with no pain relief or contraction pattern change; baseline irritability Given BMZ x1 at 2000. Given DS Bactrim for large leuks, BID 20mg  procardia x1 then later 20mg  procardia with minimal contraction pattern change and no pain relief All cervical exams were unchanged at 1cm/50%/-3 station. She was given morphine (IV and IM), dilaudid and phenergan with +sleep but per patient no change in baseline pain. Given terbutaline x1 again and had some spacing out of ctx, but no change in baseline pain DBPs were low, so procardia was not repeated.  This morning patient continues to state she has a baseline low back pain that is new, and there is cramping pain that intensifies and she does not know if the intensifying pain coincides with contractions or not.  She says it reminds her of when she was in labor with her daughter. +FM, no LOF, no VB, no new discharge  BP (!) 111/59   Pulse (!) 120   Temp 98.3 F (36.8 C) (Oral)   Resp 18   Ht 4\' 11"  (1.499 m)   Wt 59 kg (130 lb)   LMP  (LMP Unknown)   BMI 26.26 kg/m    Results for orders placed or performed during the hospital encounter of 08/03/17 (from the past 24 hour(s))  Urinalysis, Routine w reflex microscopic     Status: Abnormal   Collection Time: 08/03/17  7:52 PM  Result Value Ref Range   Color, Urine YELLOW (A) YELLOW   APPearance CLOUDY (A) CLEAR   Specific Gravity, Urine 1.010 1.005 - 1.030   pH 7.0 5.0 - 8.0   Glucose, UA NEGATIVE NEGATIVE mg/dL   Hgb urine dipstick NEGATIVE NEGATIVE   Bilirubin Urine NEGATIVE NEGATIVE   Ketones, ur NEGATIVE NEGATIVE mg/dL   Protein, ur NEGATIVE NEGATIVE mg/dL   Nitrite NEGATIVE NEGATIVE   Leukocytes, UA LARGE (A) NEGATIVE   RBC / HPF 0-5 0 - 5 RBC/hpf   WBC, UA 21-50 0 - 5 WBC/hpf   Bacteria, UA RARE (A) NONE SEEN   Squamous Epithelial / LPF 6-10 0 - 5   Mucus PRESENT   CBC     Status: Abnormal   Collection Time: 08/03/17 10:20  PM  Result Value Ref Range   WBC 13.9 (H) 3.6 - 11.0 K/uL   RBC 3.76 (L) 3.80 - 5.20 MIL/uL   Hemoglobin 10.4 (L) 12.0 - 16.0 g/dL   HCT 62.9 (L) 52.8 - 41.3 %   MCV 80.8 80.0 - 100.0 fL   MCH 27.6 26.0 - 34.0 pg   MCHC 34.1 32.0 - 36.0 g/dL   RDW 24.4 (H) 01.0 - 27.2 %   Platelets 275 150 - 440 K/uL  Type and screen [facility]     Status: None   Collection Time: 08/03/17 10:20 PM  Result Value Ref Range   ABO/RH(D) A POS    Antibody Screen NEG    Sample Expiration      08/06/2017 Performed at [facility], [address] [place], [place] [address]     She has been given the button to push when she has these coming-and-going pains to see when they occur.  OB ultrasound ordered to assess placenta status Continue bactrim, urine culture ordered Can consider mag sulfate for tocolysis but really she isn't making cervical change Concern for uterine rupture present - however no changes in FHT so not imminent Keep NPO with IVF BMZ @ 8pm Handoff to R.  McVey CNM for today's care.  ----- Ranae Plumberhelsea Mathis Cashman, MD Attending Obstetrician and Gynecologist [facility], Department of OB/GYN [facility]

## 2017-08-04 NOTE — Anesthesia Procedure Notes (Signed)
Spinal  Start time: 08/04/2017 9:32 PM End time: 08/04/2017 9:35 PM Staffing Anesthesiologist: Naomie DeanKephart, William K, MD Resident/CRNA: Irving BurtonBachich, Robin Pafford, CRNA Performed: resident/CRNA  Preanesthetic Checklist Completed: patient identified, site marked, surgical consent, pre-op evaluation, IV checked, risks and benefits discussed and monitors and equipment checked Spinal Block Patient position: sitting Prep: ChloraPrep Patient monitoring: heart rate, continuous pulse ox and blood pressure Approach: midline Location: L3-4 Injection technique: single-shot Needle Needle type: Pencan  Needle gauge: 24 G

## 2017-08-04 NOTE — Progress Notes (Addendum)
Amanda Welch is a 22 y.o. female. She is at 521w5d gestation. No LMP recorded (lmp unknown). Patient is pregnant. Estimated Date of Delivery: 09/03/17  [facility]   Current pregnancy complicated by:  Previous C/S x 2 Closely spaced pregnancies Hx anemia Hx pre-eclampsia with g1 Late prenatal care at 25wks  Chief complaint: C/o generalized constant back pain with overall abdominal crampiness that comes and goes. Denies incisional pain, unable to pinpoint specific location of abdominal pain, "just all over with contractions" No flank pain, malaise or fever.  Severity: 7/10 Aggravating or alleviating conditions: reports that none of the interventions have helped with pain.  Associated signs/symptoms: denies LOF or VB.    S: Resting comfortably. Active fetal movement. Denies: HA, visual changes, SOB, or RUQ/epigastric pain.  Maternal Medical History:   Past Medical History:  Diagnosis Date  . Anemia   . Preeclampsia    with G1    Past Surgical History:  Procedure Laterality Date  . CESAREAN SECTION  06/26/2013  . CESAREAN SECTION N/A 10/18/2016   Procedure: CESAREAN SECTION - REPEAT;  Surgeon: Amanda Welch, Bethany, Welch;  Location: [facility] ORS;  Service: Obstetrics;  Laterality: N/A;    No Known Allergies  Prior to Admission medications   Medication Sig Start Date End Date Taking? Authorizing Provider  ferrous sulfate 325 (65 FE) MG tablet Take 1 tablet (325 mg total) by mouth 2 (two) times daily with a meal. 07/28/16  Yes Amanda NovakJackson, Stephen Welch, Welch  Prenatal Vit-Fe Fumarate-FA (MULTIVITAMIN-PRENATAL) 27-0.8 MG TABS tablet Take 1 tablet by mouth daily at 12 noon. 04/08/17  Yes Amanda Welch  Prenatal-DSS-FeCb-FeGl-FA (CITRANATAL BLOOM) 90-1 MG TABS Take 1 tablet by mouth daily. 07/23/16  Yes Amanda Welch, CNM  oxyCODONE-acetaminophen (PERCOCET/ROXICET) 5-325 MG tablet Take 1-2 tablets by mouth every 4 (four) hours as needed (pain scale >  7). Patient not taking: Reported on 08/03/2017 10/20/16   Amanda Welch, CNM      Social History: She  reports that she has never smoked. She has never used smokeless tobacco. She reports that she does not drink alcohol or use drugs.  Family History: family history includes Diabetes in her maternal grandfather.  no history of gyn cancers  Review of Systems: A full review of systems was performed and negative except as noted in the HPI.     O:  BP (!) 106/57   Pulse (!) 105   Temp 98.6 F (37 C) (Oral)   Resp 18   Ht 4\' 11"  (1.499 m)   Wt 130 lb (59 kg)   LMP  (LMP Unknown)   BMI 26.26 kg/m  Results for orders placed or performed during the hospital encounter of 08/03/17 (from the past 48 hour(s))  Urinalysis, Routine Welch reflex microscopic   Collection Time: 08/03/17  7:52 PM  Result Value Ref Range   Color, Urine YELLOW (A) YELLOW   APPearance CLOUDY (A) CLEAR   Specific Gravity, Urine 1.010 1.005 - 1.030   pH 7.0 5.0 - 8.0   Glucose, UA NEGATIVE NEGATIVE mg/dL   Hgb urine dipstick NEGATIVE NEGATIVE   Bilirubin Urine NEGATIVE NEGATIVE   Ketones, ur NEGATIVE NEGATIVE mg/dL   Protein, ur NEGATIVE NEGATIVE mg/dL   Nitrite NEGATIVE NEGATIVE   Leukocytes, UA LARGE (A) NEGATIVE   RBC / HPF 0-5 0 - 5 RBC/hpf   WBC, UA 21-50 0 - 5 WBC/hpf   Bacteria, UA RARE (A) NONE SEEN   Squamous Epithelial / LPF 6-10 0 -  5   Mucus PRESENT   CBC   Collection Time: 08/03/17 10:20 PM  Result Value Ref Range   WBC 13.9 (H) 3.6 - 11.0 K/uL   RBC 3.76 (L) 3.80 - 5.20 MIL/uL   Hemoglobin 10.4 (L) 12.0 - 16.0 g/dL   HCT 16.1 (L) 09.6 - 04.5 %   MCV 80.8 80.0 - 100.0 fL   MCH 27.6 26.0 - 34.0 pg   MCHC 34.1 32.0 - 36.0 g/dL   RDW 40.9 (H) 81.1 - 91.4 %   Platelets 275 150 - 440 K/uL  Type and screen [facility]   Collection Time: 08/03/17 10:20 PM  Result Value Ref Range   ABO/RH(Welch) A POS    Antibody Screen NEG    Sample Expiration      08/06/2017 Performed at  [facility], [address] [place], [place] [address]   Comprehensive metabolic panel   Collection Time: 08/04/17  8:52 AM  Result Value Ref Range   Sodium 138 135 - 145 mmol/L   Potassium 4.0 3.5 - 5.1 mmol/L   Chloride 112 (H) 101 - 111 mmol/L   CO2 19 (L) 22 - 32 mmol/L   Glucose, Bld 115 (H) 65 - 99 mg/dL   BUN 6 6 - 20 mg/dL   Creatinine, Ser 6.21 0.44 - 1.00 mg/dL   Calcium 8.6 (L) 8.9 - 10.3 mg/dL   Total Protein 6.0 (L) 6.5 - 8.1 g/dL   Albumin 2.8 (L) 3.5 - 5.0 g/dL   AST 27 15 - 41 U/L   ALT 16 14 - 54 U/L   Alkaline Phosphatase 110 38 - 126 U/L   Total Bilirubin 0.5 0.3 - 1.2 mg/dL   GFR calc non Af Amer >60 >60 mL/min   GFR calc Af Amer >60 >60 mL/min   Anion gap 7 5 - 15     Constitutional: NAD, AAOx3  HE/ENT: extraocular movements grossly intact, moist mucous membranes CV: RRR PULM: nl respiratory effort, CTABL     Abd: gravid, non-tender, non-distended, soft      Ext: Non-tender, Nonedematous   Psych: mood appropriate, speech normal Pelvic: repeat SVE by RN at 1310:  unchanged, 1/50/OOP  Fetal  monitoring: Cat 1 Appropriate for GA Baseline: 135bpm Variability: moderate Accelerations: present x >2 Decelerations absent Toco: Uterine irritability with irreg UCs 4-91min; palpate mild.     A/P: 21 y.o. [redacted]Welch[redacted]Welch here for antenatal surveillance for generalized abdominal and back pain.   No evidence of labor, no cervical change over 24hrs.   Fetal Wellbeing: Reassuring Cat 1 tracing.  OB US with no e/o abruption; renal US with mild hydronephrosis on right, absence of right ureteral jet. No evidence of   Welch/Welch Dr Amanda Welch, pt to have 2nd BMZ injection this evening, continued observation and pain mgmt. Continue procardia 10mg  q6h, oxycodone IR 5mg  q4h; stool softener.   Comfort measures with k-Pad.    Welch, Amanda A, CNM 08/04/2017  2:13 PM

## 2017-08-04 NOTE — Brief Op Note (Signed)
08/03/2017 - 08/04/2017  Date and time of service  08/04/17 delivery at 2149  PATIENT:  Amanda DachKiana Hoefle  22 y.o. female  PRE-OPERATIVE DIAGNOSIS:  uncontrolled abdominal pain 35+5 weeks   POST-OPERATIVE DIAGNOSIS:  uncontrolled abdominal pain  uterine scar dehiscence  PROCEDURE:  Procedure(s): CESAREAN SECTION (N/A) Repeat LTCS  SURGEON:  Surgeon(s) and Role:    * Schermerhorn, Ihor Austinhomas J, MD - Primary  PHYSICIAN ASSISTANT: Mcvey cnm   ASSISTANTS: none   ANESTHESIA:   spinal  EBL:  500 mL   BLOOD ADMINISTERED:none  DRAINS: Urinary Catheter (Foley)   LOCAL MEDICATIONS USED:  MARCAINE   , BUPIVICAINE , Amount: 60 ml and OTHER ns  SPECIMEN:  No Specimen  DISPOSITION OF SPECIMEN:  N/A  COUNTS:  YES  TOURNIQUET:  * No tourniquets in log *  DICTATION: .Other Dictation: Dictation Number verbal   PLAN OF CARE: Admit to inpatient   PATIENT DISPOSITION:  PACU - hemodynamically stable.   Delay start of Pharmacological VTE agent (>24hrs) due to surgical blood loss or risk of bleeding: not applicable

## 2017-08-04 NOTE — H&P (Signed)
OB History & Physical   History of Present Illness:  Chief Complaint: abdominal pain with back pain x 2days  HPI:  Amanda Welch is a 22 y.o. G54P2002 female at [redacted]w[redacted]d dated by [place] at 18+6wks.  She presents to L&D for abdominal pain with back pain; admitted with refractory pain that has not responded to any interventions including narcotics PO or IV, tocolytics or non-pharmacologic interventions  Active FM, irregular UCs, no LOF or VB.     Pregnancy Issues: 1. Previous C/S x 2 2. Closely spaced pregnancies 3. Hx anemia 4. Hx pre-eclampsia with g1 5. Late prenatal care at 25wks   Maternal Medical History:   Past Medical History:  Diagnosis Date  . Anemia   . Preeclampsia    with G1    Past Surgical History:  Procedure Laterality Date  . CESAREAN SECTION  06/26/2013  . CESAREAN SECTION N/A 10/18/2016   Procedure: CESAREAN SECTION - REPEAT;  Surgeon: Christeen Douglas, MD;  Location: [facility] ORS;  Service: Obstetrics;  Laterality: N/A;    No Known Allergies  Prior to Admission medications   Medication Sig Start Date End Date Taking? Authorizing Provider  ferrous sulfate 325 (65 FE) MG tablet Take 1 tablet (325 mg total) by mouth 2 (two) times daily with a meal. 07/28/16  Yes Conard Novak, MD  Prenatal Vit-Fe Fumarate-FA (MULTIVITAMIN-PRENATAL) 27-0.8 MG TABS tablet Take 1 tablet by mouth daily at 12 noon. 04/08/17  Yes Rockne Menghini, MD  Prenatal-DSS-FeCb-FeGl-FA (CITRANATAL BLOOM) 90-1 MG TABS Take 1 tablet by mouth daily. 07/23/16  Yes Farrel Conners, CNM  oxyCODONE-acetaminophen (PERCOCET/ROXICET) 5-325 MG tablet Take 1-2 tablets by mouth every 4 (four) hours as needed (pain scale > 7). Patient not taking: Reported on 08/03/2017 10/20/16   Sharee Pimple, CNM     Prenatal care site: [facility]   Social History: She  reports that she has never smoked. She has never used smokeless tobacco. She reports that she does not drink alcohol or use drugs.  Family  History: family history includes Diabetes in her maternal grandfather.   Review of Systems: A full review of systems was performed and negative except as noted in the HPI.     Physical Exam:  Vital Signs: BP (!) 106/56   Pulse (!) 109   Temp 98.3 F (36.8 C) (Oral)   Resp 18   Ht 4\' 11"  (1.499 m)   Wt 130 lb (59 kg)   LMP  (LMP Unknown)   BMI 26.26 kg/m  General: no acute distress.  HEENT: normocephalic, atraumatic Heart: regular rate & rhythm.  No murmurs/rubs/gallops Lungs: clear to auscultation bilaterally, normal respiratory effort Abdomen: soft, gravid, non-tender;  EFW: 5.5lbs Pelvic:   External: Normal external female genitalia  Cervix: Dilation: 1 / Effacement (%): 50 / Station: -3    Extremities: non-tender, symmetric, no edema bilaterally.  DTRs: 2+  Neurologic: Alert & oriented x 3.    Results for orders placed or performed during the hospital encounter of 08/03/17 (from the past 24 hour(s))  CBC     Status: Abnormal   Collection Time: 08/03/17 10:20 PM  Result Value Ref Range   WBC 13.9 (H) 3.6 - 11.0 K/uL   RBC 3.76 (L) 3.80 - 5.20 MIL/uL   Hemoglobin 10.4 (L) 12.0 - 16.0 g/dL   HCT 16.1 (L) 09.6 - 04.5 %   MCV 80.8 80.0 - 100.0 fL   MCH 27.6 26.0 - 34.0 pg   MCHC 34.1 32.0 - 36.0 g/dL  RDW 14.9 (H) 11.5 - 14.5 %   Platelets 275 150 - 440 K/uL  Type and screen [facility]     Status: None   Collection Time: 08/03/17 10:20 PM  Result Value Ref Range   ABO/RH(D) A POS    Antibody Screen NEG    Sample Expiration      08/06/2017 Performed at [facility], [address] [place], [place] [address]   Comprehensive metabolic panel     Status: Abnormal   Collection Time: 08/04/17  8:52 AM  Result Value Ref Range   Sodium 138 135 - 145 mmol/L   Potassium 4.0 3.5 - 5.1 mmol/L   Chloride 112 (H) 101 - 111 mmol/L   CO2 19 (L) 22 - 32 mmol/L   Glucose, Bld 115 (H) 65 - 99 mg/dL   BUN 6 6 - 20 mg/dL   Creatinine, Ser 6.04 0.44  - 1.00 mg/dL   Calcium 8.6 (L) 8.9 - 10.3 mg/dL   Total Protein 6.0 (L) 6.5 - 8.1 g/dL   Albumin 2.8 (L) 3.5 - 5.0 g/dL   AST 27 15 - 41 U/L   ALT 16 14 - 54 U/L   Alkaline Phosphatase 110 38 - 126 U/L   Total Bilirubin 0.5 0.3 - 1.2 mg/dL   GFR calc non Af Amer >60 >60 mL/min   GFR calc Af Amer >60 >60 mL/min   Anion gap 7 5 - 15    Pertinent Results:  Prenatal Labs: Blood type/Rh  A Pos  Antibody screen neg  Rubella Immune  Varicella Immune  RPR NR  HBsAg Neg  HIV NR  GC neg  Chlamydia neg  Genetic screening  late to care  1 hour GTT  94  GBS  unknown   FHT: Cat I tracing,  TOCO: irregular q10-60min SVE:  Dilation: 1 / Effacement (%): 50 / Station: -3    Cephalic by leopolds  US Ob Limited  Result Date: 08/04/2017 CLINICAL DATA:  Worsening pelvic pain.  [redacted] weeks gestational age. EXAM: LIMITED OBSTETRIC ULTRASOUND FINDINGS: Number of Fetuses: 1 Heart Rate:  132 bpm Movement: Yes Presentation: Cephalic Placental Location: Anterior; no abruption visualized Previa: No Amniotic Fluid (Subjective):  Within normal limits. AFI: 13.2 cm BPD: 8.7 cm 35 w  0 d MATERNAL FINDINGS: Cervix:  Appears closed. Uterus/Adnexae: No abnormality visualized. IMPRESSION: Single living intrauterine fetus in cephalic presentation. Normal amniotic fluid volume. No evidence of placental abruption or previa. This limited exam does not comprehensively evaluate fetal size, dating, or anatomy; follow-up complete OB US should be considered if further fetal assessment is warranted. Electronically Signed   By: Myles Rosenthal M.D.   On: 08/04/2017 10:50   US Renal  Result Date: 08/04/2017 CLINICAL DATA:  Back pain for 2 days.  Third trimester pregnancy. EXAM: RENAL / URINARY TRACT ULTRASOUND COMPLETE COMPARISON:  07/28/2016 MRI. FINDINGS: Right Kidney: Length: 11.3 cm. Moderate right-sided hydronephrosis. Normal echogenicity. Left Kidney: Length: 11.7 cm. Echogenicity within normal limits. No mass or  hydronephrosis visualized. Bladder: Within normal limits. The left ureteric jet is visualized. The right ureteric jet is not visualized. IMPRESSION: Moderate right-sided hydronephrosis. This is most likely physiologic in the setting of third trimester pregnancy. Distal obstruction cannot be excluded, especially given absence of right ureteric jet. Electronically Signed   By: Jeronimo Greaves M.D.   On: 08/04/2017 15:17    Assessment:  Amanda Welch is a 22 y.o. G42P2002 female at [redacted]w[redacted]d with abdominal and LBP, previous C/S  x 2.    Plan:  1. Admit to Labor & Delivery; consents reviewed and obtained  2. Fetal Well being  - Fetal Tracing: Cat I tracing - Group B Streptococcus ppx indicated: unknown GBS  3. Routine OB: - Prenatal labs reviewed, as above - Rh A Pos - CBC, T&S, RPR on admit - Clear fluids, IVF  4. Planned C/S tonight per Dr Feliberto GottronSchermerhorn due to unremitting pain x 2d with no response after tocolytics and narcotics   5. Post Partum Planning: - Infant feeding: breastfeeding - Contraception: undecided  [place] Ganesh A, CNM 08/04/17 9:27 PM

## 2017-08-04 NOTE — Discharge Summary (Signed)
Obstetric Discharge Summary   Patient ID: Patient Name: Amanda Welch DOB: 10-23-1995 MRN: [id number]  Date of Admission: 08/03/2017 Date of Delivery:08/04/17 at 2149 Delivered by: Jennell Corner , MD Date of Discharge: 08/07/2017 Primary OB: [facility]  LMP:No LMP recorded (lmp unknown). EDC Estimated Date of Delivery: 09/03/17 Gestational Age at Delivery: [redacted]w[redacted]d   Antepartum complications: significant abdominal not alleviated by narcotics and tocolytics. Proceeded to LTCS + uterine scar dehiscence  Admitting Diagnosis: as above   Secondary Diagnoses: Patient Active Problem List   Diagnosis Date Noted  . Abdominal pain in pregnancy, third trimester 08/04/2017  . Postoperative state 08/04/2017  . Labor and delivery indication for care or intervention 08/03/2017  . Supervision of normal pregnancy in third trimester 10/18/2016  . Labor and delivery, indication for care 09/14/2016  . Anemia affecting pregnancy in third trimester 08/07/2016  . Abdominal pain affecting pregnancy, antepartum 07/28/2016  . GBS (group b Streptococcus) UTI complicating pregnancy, second trimester 07/26/2016  . Indication for care in labor and delivery, antepartum 07/26/2016  . Nausea and vomiting during pregnancy 07/23/2016  . Lower abdominal pain 07/23/2016  . Pregnancy with history of cesarean section, antepartum 07/23/2016  . Insufficient prenatal care in second trimester 07/23/2016  . Hx of preeclampsia, prior pregnancy, currently pregnant 07/23/2016    Augmentation:none Complications: PTD 35+5 Intrapartum complications/course: pain not controlled with IV /po narcotics and tocolytics Delivery Type: repeat cesarean section, low transverse incision Anesthesia: spinal Placenta :manual Laceration: none Episiotomy: n/a Newborn Data: female  apgars 7/8   Postpartum Course  Patient had a postpartum course complicated by elevated WBC and fundal tenderness on POD#1. This tenderness had  dissipated by POD#2 and her WBC was trending down. By time of discharge on POD#3, her pain was controlled on oral pain medications; she had appropriate lochia and was ambulating, voiding without difficulty and tolerating regular diet.  She was deemed stable for discharge to home.       Labs: CBC Latest Ref Rng & Units 08/07/2017 08/06/2017 08/05/2017  WBC 3.6 - 11.0 K/uL 14.3(H) 14.6(H) 18.0(H)  Hemoglobin 12.0 - 16.0 g/dL 4.0(J) 8.1(X) 9.1(Y)  Hematocrit 35.0 - 47.0 % 27.9(L) 26.4(L) 24.4(L)  Platelets 150 - 440 K/uL 229 215 234   A POS  Physical exam:  BP 95/69 (BP Location: Left Arm)   Pulse 79   Temp 98 F (36.7 C) (Oral)   Resp 20   Ht 4\' 11"  (1.499 m)   Wt 59 kg (130 lb)   LMP  (LMP Unknown)   SpO2 99%   Breastfeeding? Unknown   BMI 26.26 kg/m  General: alert and no distress CV: RRR Pulm: normal respiratory effort, CTABL Lochia: appropriate Abdomen: soft, NT Uterine Fundus: firm, below umbilicus Incision: healing well, scant amount of serosanguinous drainage, small area of skin separation with internal wound well approximated, no significant erythema Extremities: No evidence of DVT seen on physical exam. No lower extremity edema.   Disposition: stable, discharge to home Baby Feeding: breastmilk & formula Baby Disposition: home with mom  Contraception: TBD  Prenatal Labs:  BT A+ Rubella Imm  Varicella imm Others nl    Plan:  Elany Felix was discharged to home in good condition. Follow-up appointment at [facility] with delivery provider in 2 weeks  Discharge Instructions: Per After Visit Summary. Activity: Advance as tolerated. Pelvic rest for 6 weeks.   Diet: Regular Discharge Medications: Allergies as of 08/07/2017   No Known Allergies     Medication List  STOP taking these medications   CITRANATAL BLOOM 90-1 MG Tabs   oxyCODONE-acetaminophen 5-325 MG tablet Commonly known as:  PERCOCET/ROXICET     TAKE these medications    acetaminophen 325 MG tablet Commonly known as:  TYLENOL Take 2 tablets (650 mg total) by mouth every 4 (four) hours as needed for mild pain or moderate pain.   ferrous sulfate 325 (65 FE) MG tablet Take 1 tablet (325 mg total) by mouth 2 (two) times daily with a meal.   ibuprofen 600 MG tablet Commonly known as:  ADVIL,MOTRIN Take 1 tablet (600 mg total) by mouth every 6 (six) hours.   multivitamin-prenatal 27-0.8 MG Tabs tablet Take 1 tablet by mouth daily at 12 noon.   nitrofurantoin (macrocrystal-monohydrate) 100 MG capsule Commonly known as:  MACROBID Take 1 capsule (100 mg total) by mouth every 12 (twelve) hours.   oxyCODONE 5 MG immediate release tablet Commonly known as:  ROXICODONE Take 1 tablet (5 mg total) by mouth every 6 (six) hours as needed for severe pain or breakthrough pain.   senna-docusate 8.6-50 MG tablet Commonly known as:  Senokot-S Take 2 tablets by mouth at bedtime as needed for mild constipation.      Outpatient follow up:  Follow-up Information    Schermerhorn, Ihor Austinhomas J, MD. Schedule an appointment as soon as possible for a visit in 2 week(s).   Specialty:  Obstetrics and Gynecology Contact information: [address] [facility] [place] [address] [phone]            Signed:  Genia DelMargaret Trayonna Bachmeier, [facility] 08/07/2017 8:32 AM

## 2017-08-04 NOTE — Progress Notes (Signed)
Patient ID: Amanda Welch, female   DOB: 08/15/1995, 22 y.o.   MRN: [id number] Pt now with 2 day h/o of abdominal pain / LBP not improving despite narcotics and tocolytics.  2 prior c/s. Possibility of uterine scar separation  Or significant scar tissue . I have spelled out options of Magnesium sulfate and IV narcotics and continued observation vs repeat LTCS  Tonight . Pt is aware of the risk of prematurity and possible NICU admission . She is aware of the risk of uterine rupture  And sequlae surrounding this . After full consideration she has elected to proceed withLTCS . She declines sterilization.  Risks of the procedure have been reviewed with the pt and all questions answered . Consents signed

## 2017-08-04 NOTE — Anesthesia Post-op Follow-up Note (Signed)
Anesthesia QCDR form completed.        

## 2017-08-04 NOTE — Progress Notes (Signed)
Patient has been getting flexeril and oxycodone as ordered with no relief of pain. Patients vital still WNL. Fetal heart assessment category I strip with no decelerations. Pt having occasional contractions. Cervix remains unchanged from 1/50/-3. Patient's mother here and request to speak with provider about the plan of care. Provider notified and verbalized that she would be to see the patient and patient's mother when she could. Provider gave RN order to give patient a regular diet. RN gave patient menu to order food. Will continue to monitor patient.

## 2017-08-04 NOTE — Transfer of Care (Signed)
Immediate Anesthesia Transfer of Care Note  Patient: Amanda Welch  Procedure(s) Performed: CESAREAN SECTION (N/A Abdomen)  Patient Location: PACU  Anesthesia Type:Spinal  Level of Consciousness: awake, alert  and oriented  Airway & Oxygen Therapy: Patient connected to nasal cannula oxygen  Post-op Assessment: Post -op Vital signs reviewed and stable  Post vital signs: stable  Last Vitals:  Vitals Value Taken Time  BP    Temp    Pulse    Resp    SpO2      Last Pain:  Vitals:   08/04/17 1530  TempSrc:   PainSc: 7       Patients Stated Pain Goal: 0 (08/04/17 1230)  Complications: No apparent anesthesia complications

## 2017-08-05 LAB — URINE CULTURE

## 2017-08-05 LAB — CBC
HEMATOCRIT: 28.9 % — AB (ref 35.0–47.0)
HEMATOCRIT: 24.4 % — AB (ref 35.0–47.0)
Hemoglobin: 9.7 g/dL — ABNORMAL LOW (ref 12.0–16.0)
Hemoglobin: 8.2 g/dL — ABNORMAL LOW (ref 12.0–16.0)
MCH: 27 pg (ref 26.0–34.0)
MCH: 27.5 pg (ref 26.0–34.0)
MCHC: 33.4 g/dL (ref 32.0–36.0)
MCHC: 33.6 g/dL (ref 32.0–36.0)
MCV: 80.7 fL (ref 80.0–100.0)
MCV: 81.8 fL (ref 80.0–100.0)
PLATELETS: 278 10*3/uL (ref 150–440)
Platelets: 234 10*3/uL (ref 150–440)
RBC: 3.58 MIL/uL — AB (ref 3.80–5.20)
RBC: 2.98 MIL/uL — ABNORMAL LOW (ref 3.80–5.20)
RDW: 14.6 % — ABNORMAL HIGH (ref 11.5–14.5)
RDW: 14.6 % — AB (ref 11.5–14.5)
WBC: 22.6 10*3/uL — ABNORMAL HIGH (ref 3.6–11.0)
WBC: 18 10*3/uL — ABNORMAL HIGH (ref 3.6–11.0)

## 2017-08-05 LAB — RPR: RPR: NONREACTIVE

## 2017-08-05 MED ORDER — GENTAMICIN SULFATE 40 MG/ML IJ SOLN
5.0000 mg/kg | INTRAVENOUS | Status: DC
  Administered 2017-08-05: 300 mg via INTRAVENOUS
  Filled 2017-08-05: qty 7.5

## 2017-08-05 MED ORDER — CLINDAMYCIN PHOSPHATE 900 MG/50ML IV SOLN
900.0000 mg | Freq: Three times a day (TID) | INTRAVENOUS | Status: DC
  Filled 2017-08-05 (×3): qty 50

## 2017-08-05 NOTE — Anesthesia Post-op Follow-up Note (Signed)
  Anesthesia Pain Follow-up Note  Patient: Amanda Welch  Day #: 1  Date of Follow-up: 08/05/2017 Time: 7:16 AM  Last Vitals:  Vitals:   08/05/17 0155 08/05/17 0349  BP: 112/74 106/62  Pulse: (!) 104 (!) 104  Resp: 20 20  Temp: 37.1 C   SpO2: 99% 97%    Level of Consciousness: alert  Pain: mild   Side Effects:None  Catheter Site Exam:clean, dry     Plan: D/C from anesthesia care at surgeon's request  Clydene PughBeane, Lechelle Wrigley D

## 2017-08-05 NOTE — Anesthesia Postprocedure Evaluation (Signed)
Anesthesia Post Note  Patient: Amanda Welch  Procedure(s) Performed: CESAREAN SECTION (N/A Abdomen)  Patient location during evaluation: Mother Baby Anesthesia Type: Spinal Level of consciousness: oriented and awake and alert Pain management: satisfactory to patient Vital Signs Assessment: post-procedure vital signs reviewed and stable Respiratory status: respiratory function stable Cardiovascular status: stable Postop Assessment: no backache, no headache, spinal receding, able to ambulate, no apparent nausea or vomiting, patient able to bend at knees and adequate PO intake Anesthetic complications: no     Last Vitals:  Vitals:   08/05/17 0155 08/05/17 0349  BP: 112/74 106/62  Pulse: (!) 104 (!) 104  Resp: 20 20  Temp: 37.1 C   SpO2: 99% 97%    Last Pain:  Vitals:   08/05/17 0350  TempSrc:   PainSc: Thomasene RippleAsleep                 Terisa Belardo D

## 2017-08-05 NOTE — Op Note (Signed)
Amanda Welch: Wollen, Amanda Welch MEDICAL RECORD [id number] ACCOUNT [id number] DATE OF BIRTH:03-18-1995 FACILITY: [facility] LOCATION: [facility] PHYSICIAN:Malakhai Beitler Cloyde ReamsJ. Leopoldo Mazzie, MD  OPERATIVE REPORT  DATE OF PROCEDURE:  08/04/2017  PREOPERATIVE DIAGNOSES:   1.  35+5 weeks estimated gestational age. 2.  Abdominal pain uncontrolled by conservative management.  POSTOPERATIVE DIAGNOSIS:   1.  35+5 weeks estimated gestational age. 2.  Uterine scar dehiscence.  PROCEDURE:  Repeat low transverse cesarean section.  SURGEON:  Jennell Cornerhomas Ellene Bloodsaw, MD  FIRST ASSISTANBonnell Public: R. McVey, certified nurse midwife.    ANESTHESIA:  Spinal.  INDICATIONS:  A 22 year old gravida 3, para 2 patient admitted 24 hours before with lower abdominal and low back pain which were uncontrolled by IV and p.o. narcotics and tocolytic medication.  DESCRIPTION OF PROCEDURE:  After adequate spinal anesthesia, the patient was placed in dorsal supine position, hip roll on the right side.  The patient was prepped and draped in normal sterile fashion.  The patient did receive 2 grams IV Ancef prior to  commencement of the case.  Timeout was performed.  A Pfannenstiel incision was made 2 fingerbreadths above symphysis pubis superior to the prior cesarean section scars.  Sharp dissection was used to identify the fascia.  Fascia was opened in the midline  and opened in a transverse fashion.  The superior aspect of the fascia was grasped with Kocher clamps and the recti muscles were dissected free.  Inferior aspect of the fascia was grasped with Kocher clamps and the pyramidalis muscle was dissected free.   Sharp dissection was utilized to enter the peritoneal cavity.  The lower uterine segment revealed a window with uterine scar dehiscence.  Membranes visible through the previous scar.  Low transverse uterine incision was made.  Upon entry into the  endometrial cavity, clear fluid resulted.  The fetal head was brought to the incision and a Kiwi  vacuum was applied to the occiput.  With one gentle pull of the vacuum, the head was delivered.  A loose nuchal cord was reduced and a vigorous female was  delivered and dried on the mother's abdomen for 60 seconds while oral and nasopharynx were suctioned and the baby was dried.  At 60 seconds, the cord was clamped and infant was passed to nursery staff who assigned Apgar scores of 7 and 8.  The placenta  was manually delivered and the uterus was exteriorized.  Endometrial cavity was wiped clean with laparotomy tape and the cervix was opened with a ring forceps.  Uterus was somewhat floppy despite IV Pitocin and she required 0.2 mg intramuscular  Methergine to improve uterine tone.  Uterine incision was closed with #1 chromic suture in a running locking fashion.  Good approximation of edges.  Good hemostasis was noted.  Fallopian tubes and ovaries appeared normal.  Posterior cul-de-sac was  irrigated and suctioned and the uterus was placed back into the abdominal cavity.  Uterine incision again appeared hemostatic.  Pericolic gutters were wiped clean with laparotomy tapes bilaterally.  Fascia was then closed with 0 Vicryl suture in a  running nonlocking fashion.  The fascial edges were then injected with a solution of 20 mL of 1.3% Exparel with 30 mL of 0.5% Marcaine 50 mL of normal saline; 30 mL were injected in the fascial edges.  The inferior incision was loosened to aid in  reapproximating the skin.  A Bovie was used for hemostasis after irrigation.  Skin was reapproximated with Insorb absorbable staples with good cosmetic effect.  Additional 30 mL of the Exparel solution  was injected at the skin level and the procedure was  terminated.  COMPLICATIONS:  There were no complications.  ESTIMATED BLOOD LOSS:  500 mL.    INTRAOPERATIVE FLUIDS:  750 mL.  URINE OUTPUT:  50 mL.    The patient was taken to recovery room in good condition.  AN/NUANCE  D:08/04/2017 T:08/05/2017 JOB:[id number]

## 2017-08-05 NOTE — Progress Notes (Signed)
Subjective: Postpartum Day 1: Cesarean Delivery Patient reports incisional pain and tolerating PO. No fever.  Objective: Vital signs in last 24 hours: Temp:  [97.7 F (36.5 C)-99.4 F (37.4 C)] 98 F (36.7 C) (06/14 1923) Pulse Rate:  [68-112] 68 (06/14 0801) Resp:  [16-24] 20 (06/14 1923) BP: (103-117)/(62-84) 103/65 (06/14 1923) SpO2:  [97 %-100 %] 98 % (06/14 1923)  Physical Exam:  General: alert and cooperative Lochia: appropriate Uterine Fundus: firm and tender Incision: dressing dry DVT Evaluation: No evidence of DVT seen on physical exam.  Recent Labs    08/03/17 2220 08/05/17 0624  HGB 10.4* 9.7*  HCT 30.4* 28.9*  WBC 22.6  Assessment/Plan: Status post Cesarean section. Postoperative course complicated by uterine tenderness and elevated WBC. Repeat cbc ordered now and gent/clinda started IV . Plan for repeat wbc in am. Continue current care. Iron def anemia: PO iron ordered Breastfeeding: Lactation consult   Christeen DouglasBethany Brunella Welch 08/05/2017, 9:57 PM

## 2017-08-06 LAB — CBC
HEMATOCRIT: 26.4 % — AB (ref 35.0–47.0)
HEMOGLOBIN: 8.8 g/dL — AB (ref 12.0–16.0)
MCH: 27.4 pg (ref 26.0–34.0)
MCHC: 33.2 g/dL (ref 32.0–36.0)
MCV: 82.4 fL (ref 80.0–100.0)
Platelets: 215 10*3/uL (ref 150–440)
RBC: 3.21 MIL/uL — ABNORMAL LOW (ref 3.80–5.20)
RDW: 14.8 % — AB (ref 11.5–14.5)
WBC: 14.6 10*3/uL — ABNORMAL HIGH (ref 3.6–11.0)

## 2017-08-06 MED ORDER — FERROUS SULFATE 325 (65 FE) MG PO TABS
325.0000 mg | ORAL_TABLET | Freq: Two times a day (BID) | ORAL | Status: DC
  Administered 2017-08-06 (×2): 325 mg via ORAL
  Filled 2017-08-06 (×2): qty 1

## 2017-08-06 MED ORDER — FERROUS SULFATE 325 (65 FE) MG PO TABS
325.0000 mg | ORAL_TABLET | Freq: Two times a day (BID) | ORAL | Status: DC
  Administered 2017-08-07: 325 mg via ORAL
  Filled 2017-08-06: qty 1

## 2017-08-06 NOTE — Progress Notes (Signed)
Post Op Day 2  Subjective: Doing well, no concerns. Ambulating without difficulty, pain managed with PO meds, tolerating regular diet, and voiding without difficulty.   No fever/chills, chest pain, shortness of breath, nausea/vomiting, or leg pain. No nipple or breast pain.   Objective: BP 103/72 (BP Location: Left Arm)   Pulse 85   Temp 98 F (36.7 C) (Oral)   Resp 18   Ht 4\' 11"  (1.499 m)   Wt 59 kg (130 lb)   LMP  (LMP Unknown)   SpO2 99%   Breastfeeding? Unknown   BMI 26.26 kg/m    Physical Exam:  General: alert, cooperative, appears stated age and no distress Breasts: soft/nontender CV: RRR Pulm: nl effort, CTABL Abdomen: soft, non-tender, active bowel sounds Uterine Fundus: firm Incision: healing well, no significant drainage, no significant erythema Lochia: appropriate DVT Evaluation: No evidence of DVT seen on physical exam. No cords or calf tenderness. No significant calf/ankle edema.  Recent Labs    08/05/17 2241 08/06/17 1002  HGB 8.2* 8.8*  HCT 24.4* 26.4*  WBC 18.0* 14.6*  PLT 234 215    Assessment/Plan: 21 y.o. G3P2103 postop day # 2  -Continue routine PP care -Lactation consult PRN for exclusive pumping.  -Discussed contraceptive options including implant, IUDs hormonal and non-hormonal, injection, pills/ring/patch, condoms, and NFP.  -Acute blood loss anemia - hemodynamically stable and asymptomatic; start PO ferrous sulfate BID with stool softeners  -Immunization status: all Imms up to date -Plan for discharge home tomorrow if hemoglobin and WBC are stable or improved  Disposition: Continue inpatient postpartum care.   LOS: 2 days   Amanda Welch, CNM 08/06/2017, 1:51 PM   ----- Amanda Welch Certified Nurse Midwife [facility]

## 2017-08-07 LAB — CBC
HEMATOCRIT: 27.9 % — AB (ref 35.0–47.0)
HEMOGLOBIN: 9.3 g/dL — AB (ref 12.0–16.0)
MCH: 27.3 pg (ref 26.0–34.0)
MCHC: 33.2 g/dL (ref 32.0–36.0)
MCV: 82 fL (ref 80.0–100.0)
Platelets: 229 10*3/uL (ref 150–440)
RBC: 3.4 MIL/uL — ABNORMAL LOW (ref 3.80–5.20)
RDW: 15.3 % — ABNORMAL HIGH (ref 11.5–14.5)
WBC: 14.3 10*3/uL — ABNORMAL HIGH (ref 3.6–11.0)

## 2017-08-07 MED ORDER — ACETAMINOPHEN 325 MG PO TABS: 650.0000 mg | ORAL_TABLET | ORAL | 0 refills | Status: AC | PRN

## 2017-08-07 MED ORDER — IBUPROFEN 600 MG PO TABS: 600.0000 mg | ORAL_TABLET | Freq: Four times a day (QID) | ORAL | 0 refills | Status: AC

## 2017-08-07 MED ORDER — SENNOSIDES-DOCUSATE SODIUM 8.6-50 MG PO TABS: 2.0000 | ORAL_TABLET | Freq: Every evening | ORAL | 0 refills | Status: AC | PRN

## 2017-08-07 MED ORDER — NITROFURANTOIN MONOHYD MACRO 100 MG PO CAPS: 100.0000 mg | ORAL_CAPSULE | Freq: Two times a day (BID) | ORAL | 0 refills | Status: AC

## 2017-08-07 MED ORDER — FERROUS SULFATE 325 (65 FE) MG PO TABS: 325.0000 mg | ORAL_TABLET | Freq: Two times a day (BID) | ORAL | 0 refills | Status: AC

## 2017-08-07 MED ORDER — OXYCODONE HCL 5 MG PO TABS: 5.0000 mg | ORAL_TABLET | Freq: Four times a day (QID) | ORAL | 0 refills | Status: AC | PRN

## 2017-08-07 NOTE — Discharge Instructions (Signed)
After Your Delivery Discharge Instructions °  °Postpartum: Care Instructions ° °After childbirth (postpartum period), your body goes through many changes. Some of these changes happen over several weeks. In the hours after delivery, your body will begin to recover from childbirth while it prepares to breastfeed your newborn. You may feel emotional during this time. Your hormones can shift your mood without warning for no clear reason. ° °In the first couple of weeks after childbirth, many women have emotions that change from happy to sad. You may find it hard to sleep. You may cry a lot. This is called the "baby blues." These overwhelming emotions often go away within a couple of days or weeks. But it's important to discuss your feelings with your doctor. ° °You should call your care provider if you have unrelieved feelings of: °· Inability to cope °· Sadness °· Anxiety °· Lack of interest in baby °· Insomnia °· Crying ° °It is easy to get too tired and overwhelmed during the first weeks after childbirth. Don't try to do too much. Get rest whenever you can, accept help from others, and eat well and drink plenty of fluids. ° °About 4 to 6 weeks after your baby's birth, you will have a follow-up visit with your care provider. This visit is your time to talk to your provider about anything you are concerned or curious about. ° °Follow-up care is a key part of your treatment and safety. Be sure to make and go to all appointments, and call your doctor if you are having problems. It's also a good idea to know your test results and keep a list of the medicines you take. ° °How can you care for yourself at home? °· Sleep or rest when your baby sleeps. °· Get help with household chores from family or friends, if you can. Do not try to do it all yourself. °· If you have hemorrhoids or swelling or pain around the opening of your vagina, try using cold and heat. You can put ice or a cold pack on the area for 10 to 20 minutes at  a time. Put a thin cloth between the ice and your skin. Also try sitting in a few inches of warm water (sitz bath) 3 times a day and after bowel movements. °· Take pain medicines exactly as directed. °· If the provider gave you a prescription medicine for pain, take it as prescribed. °· If you do not have a prescription and need something over the counter, you can take: °· Ibuprofen (Motrin, Advil) up to 600mg every 6 hours as needed for pain °· Acetaminophen (Tylenol) up to 650mg every 4 hours as needed for pain °· Some people find it helpful to alternate between these two medications.  °· No driving for 1-2 weeks or while taking pain medications.  °· Eat more fiber to avoid constipation. Include foods such as whole-grain breads and cereals, raw vegetables, raw and dried fruits, and beans. °· Drink plenty of fluids, enough so that your urine is light yellow or clear like water. If you have kidney, heart, or liver disease and have to limit fluids, talk with your doctor before you increase the amount of fluids you drink. °· Do not put anything in the vagina for 6 weeks. This means no sex, no tampons, no douching, and no enemas. °· If you have stitches, keep the area clean by pouring or spraying warm water over the area outside your vagina and anus after you use the toilet. °·   No strenuous activity or heavy lifting for 6 weeks  °· No tub baths; showers only °· Continue prenatal vitamin and iron. °· If breastfeeding: °· Increase calories and fluids while breastfeeding. °· You may have a slight fever when your milk comes in, but it should go away on its own. If it does not, and rises above 101.0 please call the doctor. °· For breastfeeding concerns, the lactation consultant can be reached at [phone]. °· For concerns about your baby, please call your pediatrician. ° °· Keep a list of questions to bring to your postpartum visit. Your questions might be about: °· Changes in your breasts, such as lumps or  soreness. °· When to expect your menstrual period to start again. °· What form of birth control is best for you. °· Weight you have put on during the pregnancy. °· Exercise options. °· What foods and drinks are best for you, especially if you are breastfeeding. °· Problems you might be having with breastfeeding. °· When you can have sex. Some women may want to talk about lubricants for the vagina. °· Any feelings of sadness or restlessness that you are having. ° ° °When should you call for help? ° °Call 911 anytime you think you may need emergency care. For example, call if: °· You have thoughts of harming yourself, your baby, or another person. °· You passed out (lost consciousness). ° °Call the office at [phone] or seek immediate medical care if: °· If you have heavy bleeding such that you are soaking 1 pad in an hour for 2 hours °· You are dizzy or lightheaded, or you feel like you may faint. °· You have a fever; a temperature of 101.0 F or greater °· Chills °· Difficulty urinating °· Headache unrelieved by "pain meds"  °· Visual changes °· Pain in the right side of your belly near your ribs °· Breasts reddened, hard, hot to the touch or any other breast concerns °· Nipple discharge which is foul-smelling or contains pus  °· Increased pain at the site of the surgical incision  °· Incision drainage or problems °· New pain unrelieved with recommended over-the-counter dosages °· Difficulty breathing with or without chest pain  °· New leg pain, swelling, or redness, especially if it is only on one leg °· Any other concerns ° °Watch closely for changes in your health, and be sure to contact your provider if: °· You have new or worse vaginal discharge. °· You feel sad or depressed. °· You are having problems with your breasts or breastfeeding. ° ° ° °

## 2017-08-07 NOTE — Progress Notes (Signed)
Discharge instructions given. Patient verbalizes understanding of teaching. Surgical kit given. Steri strips and gauze added to incision prior to discharge. Patient discharged home via wheelchair at 1100.

## 2017-08-10 NOTE — Anesthesia Preprocedure Evaluation (Signed)
Anesthesia Evaluation  Patient identified by MRN, date of birth, ID band Patient awake    Reviewed: Allergy & Precautions, NPO status , Patient's Chart, lab work & pertinent test results  History of Anesthesia Complications Negative for: history of anesthetic complications  Airway Mallampati: II       Dental   Pulmonary neg sleep apnea, neg COPD,           Cardiovascular hypertension (gestational), (-) Past MI and (-) Orthopnea (-) dysrhythmias (-) Valvular Problems/Murmurs     Neuro/Psych neg Seizures    GI/Hepatic Neg liver ROS, neg GERD  ,  Endo/Other  neg diabetes  Renal/GU negative Renal ROS     Musculoskeletal   Abdominal   Peds  Hematology   Anesthesia Other Findings   Reproductive/Obstetrics (+) Pregnancy                             Anesthesia Physical Anesthesia Plan  ASA: II and emergent  Anesthesia Plan: Spinal   Post-op Pain Management:    Induction:   PONV Risk Score and Plan:   Airway Management Planned:   Additional Equipment:   Intra-op Plan:   Post-operative Plan:   Informed Consent: I have reviewed the patients History and Physical, chart, labs and discussed the procedure including the risks, benefits and alternatives for the proposed anesthesia with the patient or authorized representative who has indicated his/her understanding and acceptance.     Plan Discussed with:   Anesthesia Plan Comments:         Anesthesia Quick Evaluation

## 2017-08-30 ENCOUNTER — Inpatient Hospital Stay: Admission: RE | Admit: 2017-08-30 | Payer: Medicaid Other | Source: Ambulatory Visit

## 2017-08-31 ENCOUNTER — Encounter: Admission: RE | Payer: Self-pay | Source: Ambulatory Visit

## 2017-08-31 ENCOUNTER — Inpatient Hospital Stay
Admission: RE | Admit: 2017-08-31 | Payer: Medicaid Other | Source: Ambulatory Visit | Admitting: Obstetrics and Gynecology

## 2017-08-31 SURGERY — Surgical Case
Anesthesia: Choice

## 2018-02-25 ENCOUNTER — Emergency Department: Payer: Medicaid Other

## 2018-02-25 ENCOUNTER — Emergency Department
Admission: EM | Admit: 2018-02-25 | Discharge: 2018-02-25 | Disposition: A | Discharge status: Home / Self Care | Payer: Medicaid Other | Attending: Emergency Medicine | Admitting: Emergency Medicine

## 2018-02-25 ENCOUNTER — Other Ambulatory Visit: Payer: Self-pay

## 2018-02-25 ENCOUNTER — Encounter: Payer: Self-pay | Admitting: Emergency Medicine

## 2018-02-25 DIAGNOSIS — L03031 Cellulitis of right toe: Secondary | ICD-10-CM | POA: Insufficient documentation

## 2018-02-25 DIAGNOSIS — M79674 Pain in right toe(s): Secondary | ICD-10-CM | POA: Diagnosis present

## 2018-02-25 DIAGNOSIS — Z79899 Other long term (current) drug therapy: Secondary | ICD-10-CM | POA: Diagnosis not present

## 2018-02-25 MED ORDER — SULFAMETHOXAZOLE-TRIMETHOPRIM 800-160 MG PO TABS: 1.0000 | ORAL_TABLET | Freq: Two times a day (BID) | ORAL | 0 refills | Status: AC

## 2018-02-25 NOTE — ED Provider Notes (Signed)
[facility] Emergency Department Provider Note  ____________________________________________  Time seen: Approximately 10:51 PM  I have reviewed the triage vital signs and the nursing notes.   HISTORY  Chief Complaint Foot Pain    HPI Amanda Welch is a 23 y.o. female presents to the emergency department with right great toe pain since patient reports that she stubbed her toe approximately 2 months ago.  Patient reports that she presents to the emergency department tonight as she has noticed erythema along lateral aspect of the toenail with purulent exudate and became concerned.   Past Medical History:  Diagnosis Date  . Anemia   . Preeclampsia    with G1    Patient Active Problem List   Diagnosis Date Noted  . Abdominal pain in pregnancy, third trimester 08/04/2017  . Postoperative state 08/04/2017  . Labor and delivery indication for care or intervention 08/03/2017  . Supervision of normal pregnancy in third trimester 10/18/2016  . Labor and delivery, indication for care 09/14/2016  . Anemia affecting pregnancy in third trimester 08/07/2016  . Abdominal pain affecting pregnancy, antepartum 07/28/2016  . GBS (group b Streptococcus) UTI complicating pregnancy, second trimester 07/26/2016  . Indication for care in labor and delivery, antepartum 07/26/2016  . Nausea and vomiting during pregnancy 07/23/2016  . Lower abdominal pain 07/23/2016  . Pregnancy with history of cesarean section, antepartum 07/23/2016  . Insufficient prenatal care in second trimester 07/23/2016  . Hx of preeclampsia, prior pregnancy, currently pregnant 07/23/2016    Past Surgical History:  Procedure Laterality Date  . CESAREAN SECTION  06/26/2013  . CESAREAN SECTION N/A 10/18/2016   Procedure: CESAREAN SECTION - REPEAT;  Surgeon: Christeen Douglas, MD;  Location: [facility] ORS;  Service: Obstetrics;  Laterality: N/A;  . CESAREAN SECTION N/A 08/04/2017   Procedure: CESAREAN SECTION;   Surgeon: Feliberto Gottron Ihor Austin, MD;  Location: [facility] ORS;  Service: Obstetrics;  Laterality: N/A;    Prior to Admission medications   Medication Sig Start Date End Date Taking? Authorizing Provider  acetaminophen (TYLENOL) 325 MG tablet Take 2 tablets (650 mg total) by mouth every 4 (four) hours as needed for mild pain or moderate pain. 08/07/17   Genia Del, CNM  ferrous sulfate 325 (65 FE) MG tablet Take 1 tablet (325 mg total) by mouth 2 (two) times daily with a meal. 08/07/17   Genia Del, CNM  ibuprofen (ADVIL,MOTRIN) 600 MG tablet Take 1 tablet (600 mg total) by mouth every 6 (six) hours. 08/07/17   Genia Del, CNM  nitrofurantoin, macrocrystal-monohydrate, (MACROBID) 100 MG capsule Take 1 capsule (100 mg total) by mouth every 12 (twelve) hours. 08/07/17   Genia Del, CNM  oxyCODONE (ROXICODONE) 5 MG immediate release tablet Take 1 tablet (5 mg total) by mouth every 6 (six) hours as needed for severe pain or breakthrough pain. 08/07/17 08/07/18  Genia Del, CNM  Prenatal Vit-Fe Fumarate-FA (MULTIVITAMIN-PRENATAL) 27-0.8 MG TABS tablet Take 1 tablet by mouth daily at 12 noon. 04/08/17   Rockne Menghini, MD  senna-docusate (SENOKOT-S) 8.6-50 MG tablet Take 2 tablets by mouth at bedtime as needed for mild constipation. 08/07/17   Genia Del, CNM  sulfamethoxazole-trimethoprim (BACTRIM DS,SEPTRA DS) 800-160 MG tablet Take 1 tablet by mouth 2 (two) times daily for 7 days. 02/25/18 03/04/18  Orvil Feil, PA-C    Allergies Patient has no known allergies.  Family History  Problem Relation Age of Onset  . Diabetes Maternal Grandfather   . Breast cancer Neg Hx   . Ovarian cancer  Neg Hx     Social History Social History   Tobacco Use  . Smoking status: Never Smoker  . Smokeless tobacco: Never Used  Substance Use Topics  . Alcohol use: No  . Drug use: No     Review of Systems  Constitutional: No fever/chills Eyes: No visual changes. No  discharge ENT: No upper respiratory complaints. Cardiovascular: no chest pain. Respiratory: no cough. No SOB. Gastrointestinal: No abdominal pain.  No nausea, no vomiting.  No diarrhea.  No constipation. Genitourinary: Negative for dysuria. No hematuria Musculoskeletal: Patient has right great toe pain.  Skin: Negative for rash, abrasions, lacerations, ecchymosis. Neurological: Negative for headaches, focal weakness or numbness.  ____________________________________________   PHYSICAL EXAM:  VITAL SIGNS: ED Triage Vitals  Enc Vitals Group     BP 02/25/18 2130 126/85     Pulse Rate 02/25/18 2130 95     Resp 02/25/18 2130 18     Temp 02/25/18 2130 98.6 F (37 C)     Temp Source 02/25/18 2130 Oral     SpO2 02/25/18 2130 98 %     Weight 02/25/18 2128 130 lb (59 kg)     Height 02/25/18 2128 4\' 11"  (1.499 m)     Head Circumference --      Peak Flow --      Pain Score 02/25/18 2128 6     Pain Loc --      Pain Edu? --      Excl. in GC? --      Constitutional: Alert and oriented. Well appearing and in no acute distress. Eyes: Conjunctivae are normal. PERRL. EOMI. Head: Atraumatic. Cardiovascular: Normal rate, regular rhythm. Normal S1 and S2.  Good peripheral circulation. Respiratory: Normal respiratory effort without tachypnea or retractions. Lungs CTAB. Good air entry to the bases with no decreased or absent breath sounds. Gastrointestinal: Bowel sounds 4 quadrants. Soft and nontender to palpation. No guarding or rigidity. No palpable masses. No distention. No CVA tenderness. Musculoskeletal: Full range of motion to all extremities. No gross deformities appreciated.  Patient has draining paronychia right great toe with surrounding cellulitis. Neurologic:  Normal speech and language. No gross focal neurologic deficits are appreciated.  Skin:  Skin is warm, dry and intact. No rash noted. Psychiatric: Mood and affect are normal. Speech and behavior are normal. Patient exhibits  appropriate insight and judgement.   ____________________________________________   LABS (all labs ordered are listed, but only abnormal results are displayed)  Labs Reviewed - No data to display ____________________________________________  EKG   ____________________________________________  RADIOLOGY I personally viewed and evaluated these images as part of my medical decision making, as well as reviewing the written report by the radiologist.  Dg Foot Complete Right  Result Date: 02/25/2018 CLINICAL DATA:  Assess growth at the right great toenail. Recent toe injury. Initial encounter. EXAM: RIGHT FOOT COMPLETE - 3+ VIEW COMPARISON:  None. FINDINGS: There is no evidence of fracture or dislocation. No osseous erosions are seen. The joint spaces are preserved. There is no evidence of talar subluxation; the subtalar joint is unremarkable in appearance. Mild soft tissue swelling is noted at the great toe. The known growth at the toenail is not well characterized on radiograph. No radiopaque foreign bodies are seen. IMPRESSION: No evidence of fracture or dislocation. No osseous erosions seen. Electronically Signed   By: Roanna RaiderJeffery  Chang M.D.   On: 02/25/2018 22:27    ____________________________________________    PROCEDURES  Procedure(s) performed:    Procedures  Medications - No data to display   ____________________________________________   INITIAL IMPRESSION / ASSESSMENT AND PLAN / ED COURSE  Pertinent labs & imaging results that were available during my care of the patient were reviewed by me and considered in my medical decision making (see chart for details).  Review of the [place] CSRS was performed in accordance of the [facility] prior to dispensing any controlled drugs.      Assessment and plan Paronychia Patient presents to the emergency department with a paronychia of the right great toe that is draining with surrounding cellulitis.  Patient was treated with  Bactrim.  She was advised to follow-up with podiatry as needed.  All patient questions were answered.     ____________________________________________  FINAL CLINICAL IMPRESSION(S) / ED DIAGNOSES  Final diagnoses:  Paronychia of great toe, right      NEW MEDICATIONS STARTED DURING THIS VISIT:  ED Discharge Orders         Ordered    sulfamethoxazole-trimethoprim (BACTRIM DS,SEPTRA DS) 800-160 MG tablet  2 times daily     02/25/18 2244              This chart was dictated using voice recognition software/Dragon. Despite best efforts to proofread, errors can occur which can change the meaning. Any change was purely unintentional.    Orvil FeilWoods, Marico Buckle M, PA-C 02/25/18 [id number]    Phineas SemenGoodman, [place], MD 02/25/18 [phone]

## 2018-02-25 NOTE — ED Triage Notes (Signed)
Pt arrives ambulatory to desk with c/o right big toe pain x 2 months where she "jammed it". Pt is in NAD.

## 2018-03-21 IMAGING — MR MR ABDOMEN W/O CM
4 of 8 series · 20 of 48 positions shown · non-contrast
Comparison: None.

CLINICAL DATA: Abdominal pain for 1 week. 27 weeks pregnant.
Clinical suspicion for appendicitis.

EXAM:
MRI ABDOMEN AND PELVIS WITHOUT CONTRAST
TECHNIQUE: Multiplanar multisequence MR imaging of the abdomen and pelvis was
performed. No intravenous contrast was administered.

[Series 3: T1 · axial · 9.0mm · 0.78mm/px · z∈[-181,+241]mm · 9 of 40 slices shown]
[im 1/40]
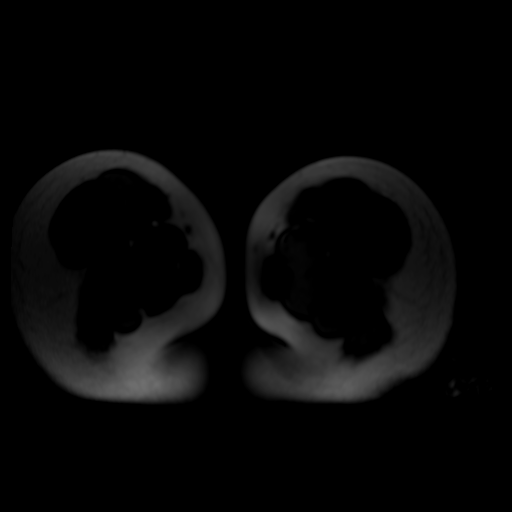
[im 5/40]
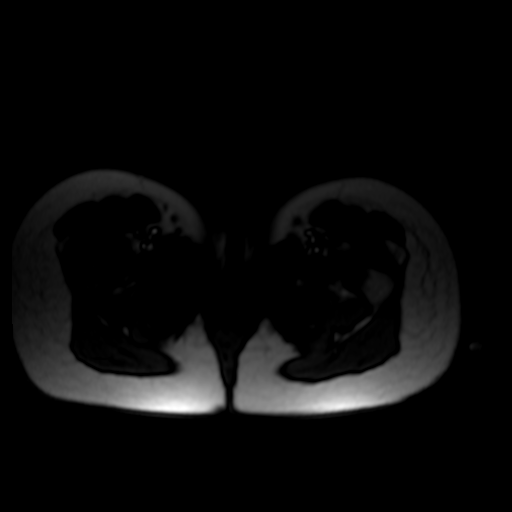
[im 10/40]
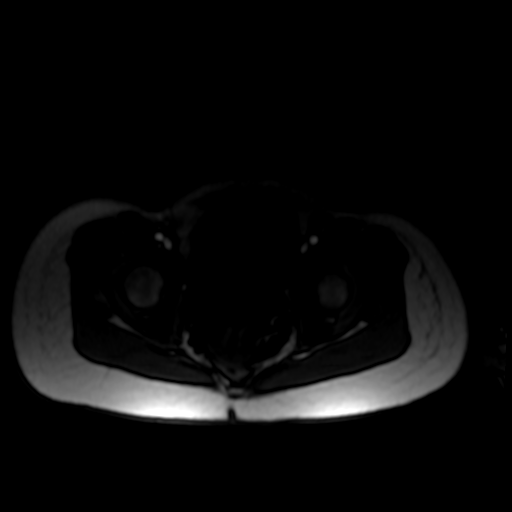
[im 15/40]
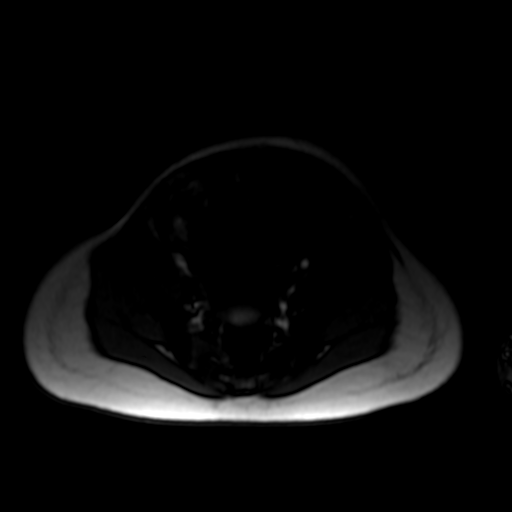
[im 20/40]
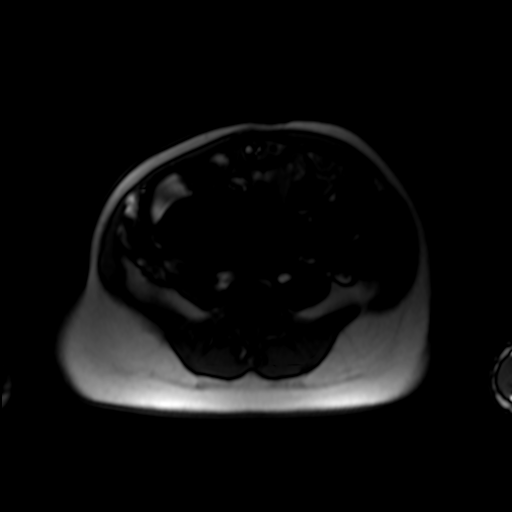
[im 25/40]
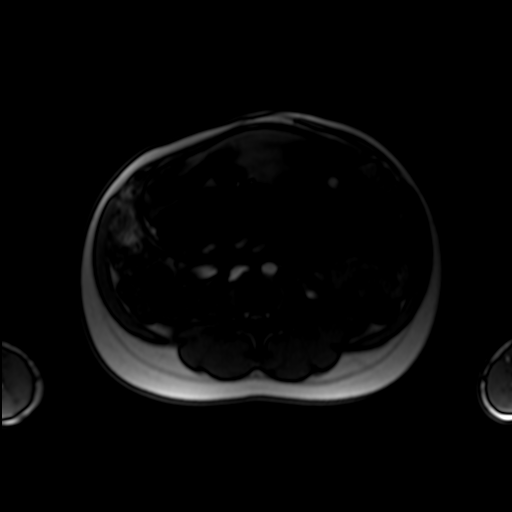
[im 30/40]
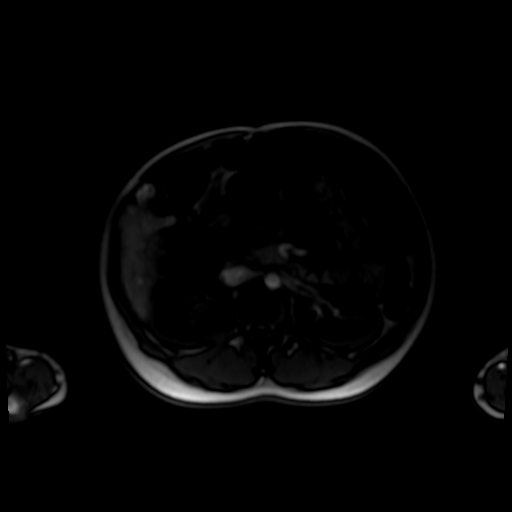
[im 35/40]
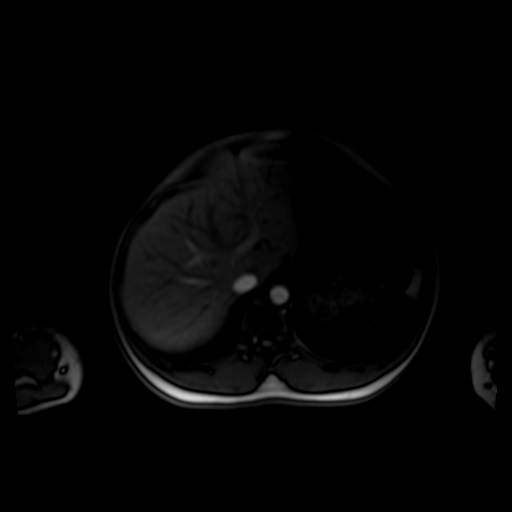
[im 40/40]
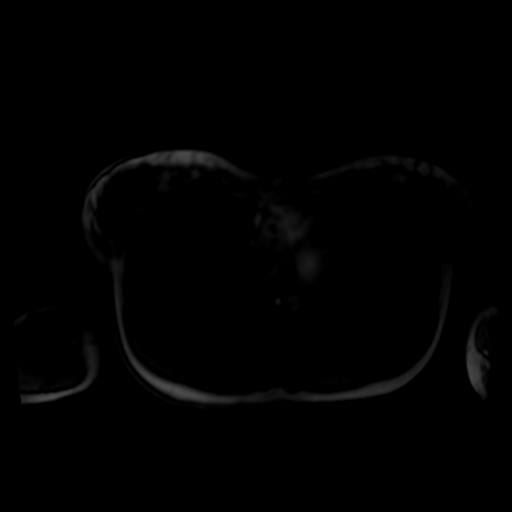

[Series 4: T2 · coronal · 7.0mm · 0.82mm/px · 5 of 24 slices shown]
[im 1/24]
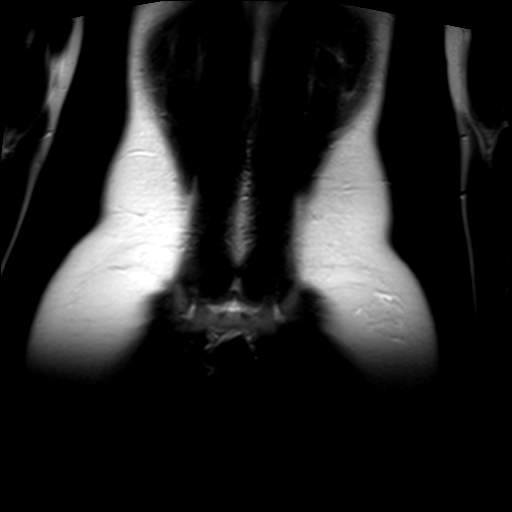
[im 5/24]
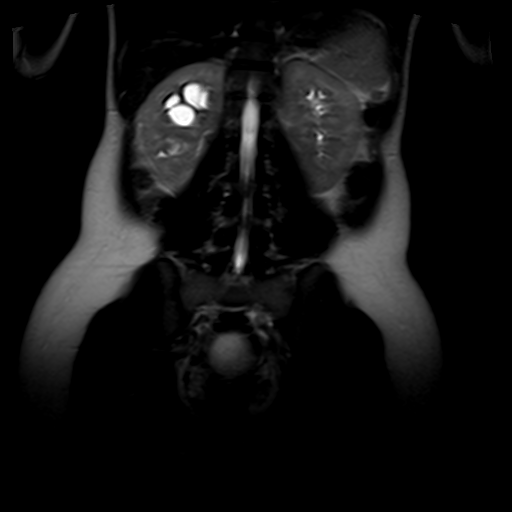
[im 10/24]
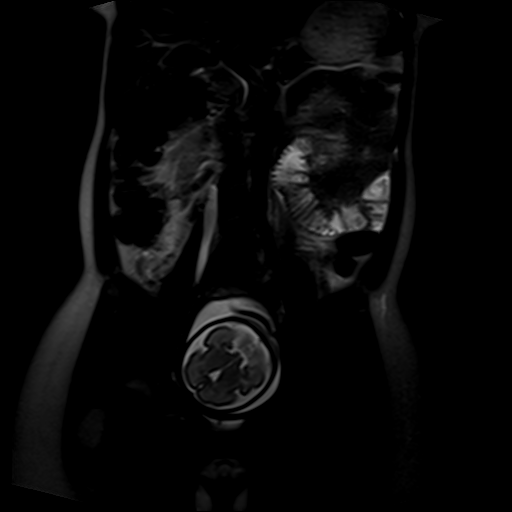
[im 14/24]
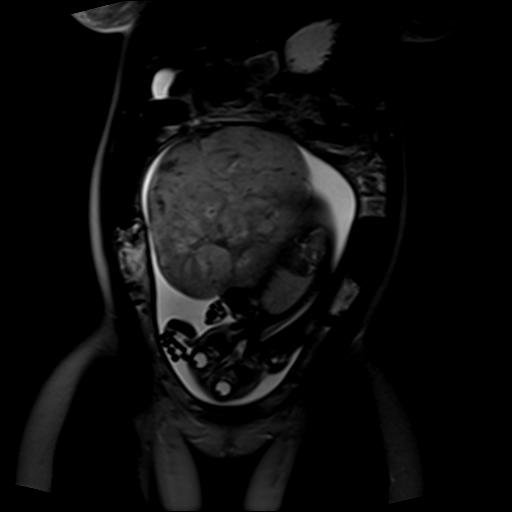
[im 24/24]
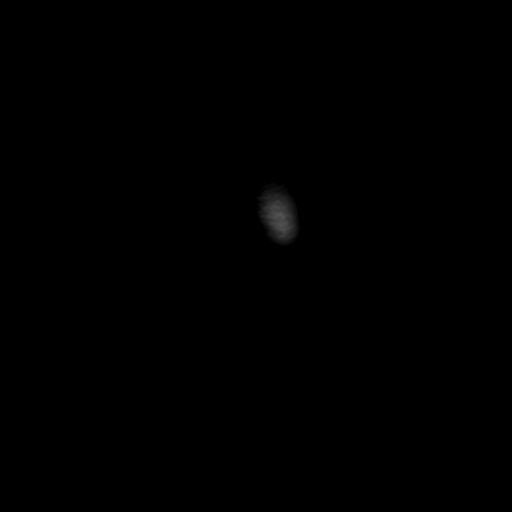

[Series 5: bSSFP · axial · 9.0mm · 0.82mm/px · z∈[-114,+156]mm · 3 of 36 slices shown]
[im 6/36]
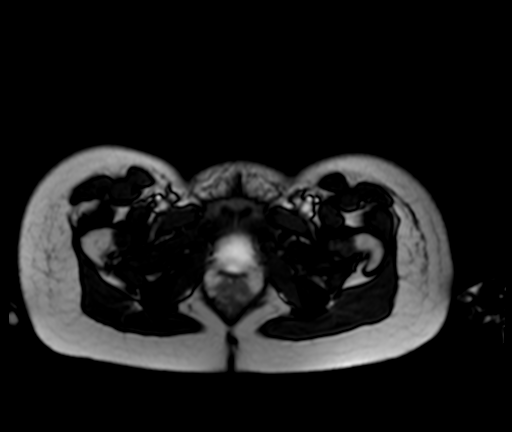
[im 21/36]
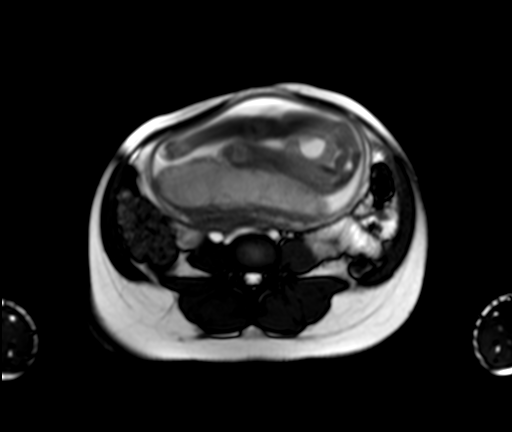
[im 31/36]
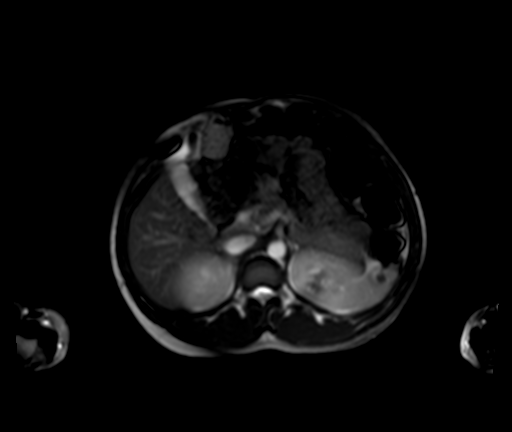

[Series 6: T2 fat-sat · axial · 8.0mm · 0.74mm/px · z∈[-114,+88]mm · 3 of 27 slices shown]
[im 6/27]
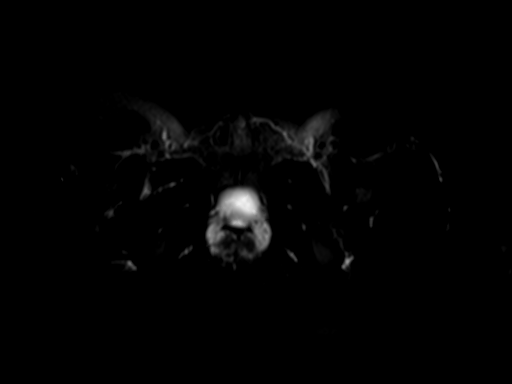
[im 16/27]
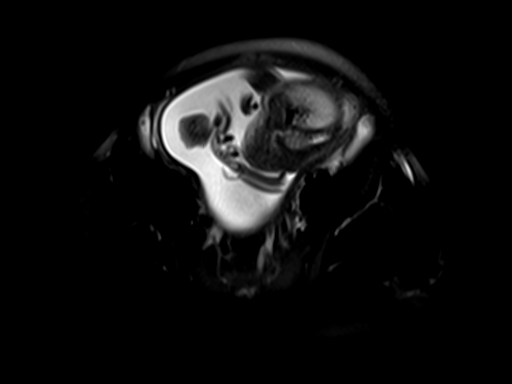
[im 27/27]
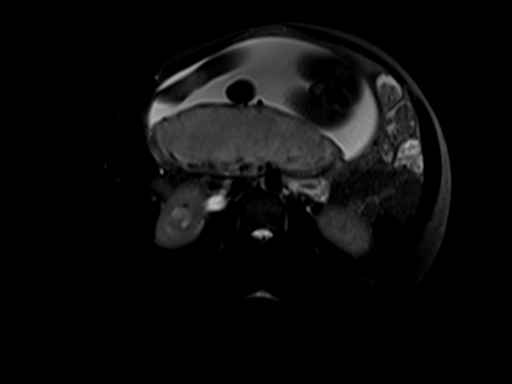

[20 of 48 positions shown; findings below may reference images not displayed]

FINDINGS: COMBINED FINDINGS FOR BOTH MR ABDOMEN AND PELVIS

Lower chest: No acute findings.

Hepatobiliary: No masses visualized on this unenhanced exam.
Gallbladder is unremarkable. No evidence of biliary ductal
dilatation.

Pancreas: No mass or inflammatory process visualized on this
unenhanced exam.

Spleen:  Within normal limits in size.

Adrenals/Urinary tract: No renal masses identified. Moderate right
hydroureteronephrosis is seen due to compression by the gravid
uterus at the pelvic brim.

Stomach/Bowel: No evidence of obstruction, inflammatory process, or
abnormal fluid collections. Appendix is not well visualized, however
no inflammatory process is identified in the right lower quadrant or
area the cecum.

Vascular/Lymphatic: No pathologically enlarged lymph nodes
identified. No evidence of abdominal aortic aneurysm.

Reproductive: Single intrauterine fetus is seen in cephalic
presentation. Anterior placenta. No evidence of previa. No pelvic or
adnexal mass identified. No evidence of free fluid.

Other:  None.

Musculoskeletal:  No suspicious bone lesions identified.
IMPRESSION: Moderate right hydronephrosis of pregnancy. No definite evidence of
appendicitis.
